# Patient Record
Sex: Female | Born: 1940
Health system: Southern US, Community
[De-identification: ages and names within clinical notes are randomized; demographics above are authoritative.]

## PROBLEM LIST (undated history)

## (undated) ENCOUNTER — Inpatient Hospital Stay: Payer: Self-pay | Admitting: Cardiology

## (undated) DIAGNOSIS — E1149 Type 2 diabetes mellitus with other diabetic neurological complication: Secondary | ICD-10-CM

## (undated) DIAGNOSIS — E782 Mixed hyperlipidemia: Secondary | ICD-10-CM

## (undated) DIAGNOSIS — E119 Type 2 diabetes mellitus without complications: Secondary | ICD-10-CM

## (undated) DIAGNOSIS — I10 Essential (primary) hypertension: Secondary | ICD-10-CM

## (undated) HISTORY — PX: TUBAL LIGATION: SHX77

## (undated) HISTORY — DX: Mixed hyperlipidemia: E78.2

## (undated) HISTORY — DX: Essential (primary) hypertension: I10

## (undated) HISTORY — PX: TEMPOROMANDIBULAR JOINT SURGERY: SHX35

## (undated) HISTORY — PX: CARDIAC CATHETERIZATION: SHX172

## (undated) HISTORY — DX: Type 2 diabetes mellitus without complications: E11.9

## (undated) HISTORY — PX: CARPAL TUNNEL RELEASE: SHX101

## (undated) HISTORY — DX: Type 2 diabetes mellitus with other diabetic neurological complication: E11.49

## (undated) HISTORY — PX: OTHER SURGICAL HISTORY: SHX169

---

## 1999-11-23 ENCOUNTER — Encounter: Payer: Self-pay | Admitting: General Surgery

## 1999-11-23 ENCOUNTER — Encounter: Admission: RE | Admit: 1999-11-23 | Discharge: 1999-11-23 | Payer: Self-pay | Admitting: General Surgery

## 2000-05-19 ENCOUNTER — Encounter: Payer: Self-pay | Admitting: General Surgery

## 2000-05-19 ENCOUNTER — Encounter: Admission: RE | Admit: 2000-05-19 | Discharge: 2000-05-19 | Payer: Self-pay | Admitting: General Surgery

## 2000-11-15 ENCOUNTER — Encounter: Payer: Self-pay | Admitting: General Surgery

## 2000-11-15 ENCOUNTER — Encounter: Admission: RE | Admit: 2000-11-15 | Discharge: 2000-11-15 | Payer: Self-pay | Admitting: General Surgery

## 2001-11-20 ENCOUNTER — Encounter: Payer: Self-pay | Admitting: Obstetrics and Gynecology

## 2001-11-20 ENCOUNTER — Encounter: Admission: RE | Admit: 2001-11-20 | Discharge: 2001-11-20 | Payer: Self-pay | Admitting: Obstetrics and Gynecology

## 2002-12-11 ENCOUNTER — Encounter: Admission: RE | Admit: 2002-12-11 | Discharge: 2002-12-11 | Payer: Self-pay | Admitting: Obstetrics and Gynecology

## 2002-12-11 ENCOUNTER — Encounter: Payer: Self-pay | Admitting: Obstetrics and Gynecology

## 2003-01-02 ENCOUNTER — Ambulatory Visit (HOSPITAL_BASED_OUTPATIENT_CLINIC_OR_DEPARTMENT_OTHER): Admission: RE | Admit: 2003-01-02 | Discharge: 2003-01-02 | Payer: Self-pay | Admitting: Orthopedic Surgery

## 2003-12-16 ENCOUNTER — Encounter: Admission: RE | Admit: 2003-12-16 | Discharge: 2003-12-16 | Payer: Self-pay | Admitting: Obstetrics and Gynecology

## 2004-12-03 ENCOUNTER — Encounter: Admission: RE | Admit: 2004-12-03 | Discharge: 2004-12-03 | Payer: Self-pay | Admitting: Obstetrics and Gynecology

## 2005-12-09 ENCOUNTER — Encounter: Admission: RE | Admit: 2005-12-09 | Discharge: 2005-12-09 | Payer: Self-pay | Admitting: *Deleted

## 2005-12-29 ENCOUNTER — Ambulatory Visit: Payer: Self-pay | Admitting: Cardiology

## 2006-12-12 ENCOUNTER — Encounter: Admission: RE | Admit: 2006-12-12 | Discharge: 2006-12-12 | Payer: Self-pay | Admitting: Obstetrics and Gynecology

## 2007-12-14 ENCOUNTER — Encounter: Admission: RE | Admit: 2007-12-14 | Discharge: 2007-12-14 | Payer: Self-pay | Admitting: Internal Medicine

## 2008-12-16 ENCOUNTER — Encounter: Admission: RE | Admit: 2008-12-16 | Discharge: 2008-12-16 | Payer: Self-pay | Admitting: Internal Medicine

## 2009-12-17 ENCOUNTER — Encounter: Admission: RE | Admit: 2009-12-17 | Discharge: 2009-12-17 | Payer: Self-pay | Admitting: Internal Medicine

## 2010-09-04 DIAGNOSIS — R079 Chest pain, unspecified: Secondary | ICD-10-CM

## 2010-10-15 ENCOUNTER — Encounter: Payer: Self-pay | Admitting: Cardiology

## 2010-10-16 ENCOUNTER — Encounter: Payer: Self-pay | Admitting: *Deleted

## 2010-10-16 ENCOUNTER — Ambulatory Visit (INDEPENDENT_AMBULATORY_CARE_PROVIDER_SITE_OTHER): Payer: Medicare Other | Admitting: Cardiology

## 2010-10-16 ENCOUNTER — Encounter: Payer: Self-pay | Admitting: Cardiology

## 2010-10-16 VITALS — BP 135/86 | HR 78 | Ht 63.0 in | Wt 133.0 lb

## 2010-10-16 DIAGNOSIS — E782 Mixed hyperlipidemia: Secondary | ICD-10-CM

## 2010-10-16 DIAGNOSIS — R079 Chest pain, unspecified: Secondary | ICD-10-CM | POA: Insufficient documentation

## 2010-10-16 DIAGNOSIS — R0602 Shortness of breath: Secondary | ICD-10-CM

## 2010-10-16 DIAGNOSIS — I10 Essential (primary) hypertension: Secondary | ICD-10-CM

## 2010-10-16 DIAGNOSIS — R9430 Abnormal result of cardiovascular function study, unspecified: Secondary | ICD-10-CM | POA: Insufficient documentation

## 2010-10-16 DIAGNOSIS — Z79899 Other long term (current) drug therapy: Secondary | ICD-10-CM

## 2010-10-16 DIAGNOSIS — E119 Type 2 diabetes mellitus without complications: Secondary | ICD-10-CM

## 2010-10-16 LAB — PROTIME-INR

## 2010-10-16 NOTE — Patient Instructions (Signed)
Follow up as scheduled. Your physician has requested that you have a cardiac catheterization. Cardiac catheterization is used to diagnose and/or treat various heart conditions. Doctors may recommend this procedure for a number of different reasons. The most common reason is to evaluate chest pain. Chest pain can be a symptom of coronary artery disease (CAD), and cardiac catheterization can show whether plaque is narrowing or blocking your heart's arteries. This procedure is also used to evaluate the valves, as well as measure the blood flow and oxygen levels in different parts of your heart. For further information please visit [email or web address] Please follow instruction sheet, as given. Your physician recommends that you go to the [facility] for lab work/chest x-ray: Do today. Your physician recommends that you continue on your current medications as directed. Please refer to the Current Medication list given to you today.

## 2010-10-16 NOTE — Assessment & Plan Note (Signed)
On oral hypoglycemic therapy, followed by Dr. Sherryll Burger.

## 2010-10-16 NOTE — Progress Notes (Signed)
Clinical Summary Desiree Baxter is a 70 y.o.female referred for cardiology consultation by Dr. Kirstie Peri. Records indicate that she has had chest pain symptoms, and was referred for a recent exercise Cardiolite, done on March 30. This study showed abnormal ST segment changes in the setting of hypertension and no chest pain. Overall functional capacity was relatively poor. Despite this, perfusion imaging showed no clear evidence of ischemia with LVEF 63%, raising the possibility of "false positive" ECG findings.  She reports a one-year history of intermittent chest pain. She describes a feeling of discomfort in her abdominal area that goes up into her chest, sometimes into the shoulders and neck. She feels diaphoresis with these symptoms, but not all the time. Sometimes her chest discomfort radiates into the left forearm down to the elbow, sometimes the wrist. She reports no clear exertional component to these symptoms. States they've been becoming more frequent. She also has been under increased stress.   Lipid profile from March 2012 showed total cholesterol 178, HDL 66, LDL 100, triglycerides 58. She reports compliance with medications outlined below.  Previous cardiac testing as outlined below as well, done through [facility].  Allergies  Allergen Reactions  . Elavil (Amitriptyline Hcl)     Too sleepy    Current outpatient prescriptions:aspirin 81 MG tablet, Take 81 mg by mouth daily.  , Disp: , Rfl: ;  diphenhydramine-acetaminophen (TYLENOL PM) 25-500 MG TABS, Take 1 tablet by mouth at bedtime as needed.  , Disp: , Rfl: ;  glyBURIDE (DIABETA) 5 MG tablet, Take 5 mg by mouth daily with breakfast.  , Disp: , Rfl: ;  metFORMIN (GLUCOPHAGE) 500 MG tablet, Take 1,000 mg by mouth 2 (two) times daily with a meal. And 1 at lunch , Disp: , Rfl:  metoprolol succinate (TOPROL-XL) 25 MG 24 hr tablet, Take 12.5 mg by mouth daily.  , Disp: , Rfl: ;  Omega-3 Fatty Acids (FISH OIL) 1200 MG CAPS, Take 2  capsules by mouth daily.  , Disp: , Rfl: ;  Red Yeast Rice 600 MG CAPS, Take 2 capsules by mouth daily.  , Disp: , Rfl: ;  DISCONTD: Ascorbic Acid (VITAMIN C) 1000 MG tablet, Take 1,000 mg by mouth daily.  , Disp: , Rfl:  DISCONTD: Calcium 600-200 MG-UNIT per tablet, Take 1 tablet by mouth 3 (three) times daily with meals.  , Disp: , Rfl: ;  DISCONTD: Flaxseed, Linseed, (FLAXSEED OIL) 1000 MG CAPS, Take 1 capsule by mouth daily.  , Disp: , Rfl: ;  DISCONTD: fluticasone (FLONASE) 50 MCG/ACT nasal spray, 2 sprays by Nasal route daily.  , Disp: , Rfl: ;  DISCONTD: imipramine (TOFRANIL) 10 MG tablet, Take 10 mg by mouth at bedtime.  , Disp: , Rfl:  DISCONTD: L-Methylfolate-B6-B12 (METANX) 2.8-25-2 MG TABS, Take 1 tablet by mouth daily.  , Disp: , Rfl: ;  DISCONTD: omeprazole (PRILOSEC) 20 MG capsule, Take 20 mg by mouth daily.  , Disp: , Rfl:   Past Medical History  Diagnosis Date  . Essential hypertension, benign   . Mixed hyperlipidemia   . Type 2 diabetes mellitus     Neuropathy  . GERD (gastroesophageal reflux disease)   . Degenerative joint disease   . Type II or unspecified type diabetes mellitus with neurological manifestations, not stated as uncontrolled   . Endometriosis   . Fibromyalgia   . Lipoma     Past Surgical History  Procedure Date  . Cataract surgery   . Tubal ligation   .  Temporomandibular joint surgery     Family History  Problem Relation Age of Onset  . Cancer Father   . Dementia Mother     Social History Ms. Sipe reports that she has never smoked. She has never used smokeless tobacco. Ms. Keil reports that she does not drink alcohol.  Review of Systems Describes anxiety at times, no palpitations or syncope. No orthopnea or PND. No melena or hematochezia. Otherwise reviewed and negative.  Physical Examination Filed Vitals:   10/16/10 1330  BP: 135/86  Pulse: 78  Normally nourished appearing woman in no acute distress. HEENT: Conjunctiva and lids normal,  oropharynx with moist mucosa. Neck: Supple, no elevated JVP or bruits, no thyromegaly. Lungs: Clear to auscultation, nonlabored. Cardiac: Regular rate and rhythm, no S3 gallop or pericardial rub. Abdomen: Soft, nontender bowel sounds present. Skin: Warm and dry. Musculoskeletal: No kyphosis. Extremities: No pitting edema, distal pulses full. Neuropsychiatric: Alert and oriented x3, affect appropriate..   ECG Sinus rhythm with nonspecific ST changes.  Studies Echocardiogram 08/31/2010: [facility] study. LVEF 60-65%, normal left atrial chamber size, mild to moderate mitral regurgitation, sclerotic aortic valve, mild to moderate tricuspid regurgitation. No pericardial effusion.  Exercise Myoview 07/11/2008: [facility] study. No diagnostic ST segment changes, normal perfusion, LVEF 68%.  Problem List and Plan

## 2010-10-16 NOTE — Assessment & Plan Note (Signed)
Recently on ACE inhibitor, switched to beta blocker by Dr. Sherryll Burger.

## 2010-10-16 NOTE — Assessment & Plan Note (Signed)
Recent LDL 100, on omega-3 supplements and red yeast rice, followed by Dr. Sherryll Burger.

## 2010-10-16 NOTE — Assessment & Plan Note (Signed)
Abnormal exercise treadmill portion of the Cardiolite study, specifically with abnormal ST segment changes in the setting of hypertension but no chest pain. While this could represent a "false positive," this needs to be clarified in light of her progressive symptoms and risk factor profile.

## 2010-10-16 NOTE — Assessment & Plan Note (Signed)
Both typical and atypical features, noted intermittently over the last year in the setting of cardiac risk factors including type 2 diabetes mellitus, hypertension, and hyperlipidemia. Recent Cardiolite showed abnormal ST segment changes, although reassuring perfusion information. She continues to have symptoms with somewhat progressive pattern. After reviewing the situation, including risks and benefits, plan is to proceed with a diagnostic cardiac catheterization to best clarify her coronary anatomy. My hope is that this will provide reassuring information.

## 2010-10-19 ENCOUNTER — Telehealth: Payer: Self-pay | Admitting: Cardiology

## 2010-10-19 ENCOUNTER — Encounter: Payer: Self-pay | Admitting: Cardiology

## 2010-10-19 ENCOUNTER — Encounter: Payer: Self-pay | Admitting: *Deleted

## 2010-10-19 NOTE — Telephone Encounter (Signed)
Left Heart cath on 10/21/10 @ [place] by Dr. Peter [place] Notification # [id number] expires 12/03/10

## 2010-10-21 ENCOUNTER — Inpatient Hospital Stay (HOSPITAL_BASED_OUTPATIENT_CLINIC_OR_DEPARTMENT_OTHER)
Admission: RE | Admit: 2010-10-21 | Discharge: 2010-10-21 | Disposition: A | Discharge status: Home / Self Care | Payer: Medicare Other | Source: Ambulatory Visit | Attending: Cardiology | Admitting: Cardiology

## 2010-10-21 DIAGNOSIS — R079 Chest pain, unspecified: Secondary | ICD-10-CM | POA: Insufficient documentation

## 2010-10-21 DIAGNOSIS — E119 Type 2 diabetes mellitus without complications: Secondary | ICD-10-CM | POA: Insufficient documentation

## 2010-10-21 DIAGNOSIS — I10 Essential (primary) hypertension: Secondary | ICD-10-CM | POA: Insufficient documentation

## 2010-10-21 DIAGNOSIS — E785 Hyperlipidemia, unspecified: Secondary | ICD-10-CM | POA: Insufficient documentation

## 2010-10-23 NOTE — Op Note (Signed)
   NAMEROSALINA, DINGWALL                         ACCOUNT NO.:  [id number]   MEDICAL RECORD NO.:  [id number]                   PATIENT TYPE:  AMB   LOCATION:  DSC                                  FACILITY:  [facility]   PHYSICIAN:  Cindee Salt, M.D.                    DATE OF BIRTH:  [birth year]   DATE OF PROCEDURE:  01/02/2003  DATE OF DISCHARGE:                                 OPERATIVE REPORT   PREOPERATIVE DIAGNOSIS:  Carpal tunnel syndrome, right hand.   POSTOPERATIVE DIAGNOSIS:  Carpal tunnel syndrome, right hand.   OPERATION:  Decompression of the median nerve.   SURGEON:  Cindee Salt, M.D.   ASSISTANT:  Dr. Chelsea Primus.   ANESTHESIA:  Forearm based IV regional.   HISTORY OF PRESENT ILLNESS:  The patient is a 70 year old female with a  history of carpal tunnel syndrome.  Median G nerve conduction was positive.  This has not responded to conservative treatment.   PROCEDURE:  The patient was brought to the operating room.  A forearm based  IV regional anesthetic was carried out without difficulty.  She was prepped  and draped using DuraPrep.  In the supine position with right arm free, a  longitudinal incision was made in the palm and carried down through  subcutaneous tissues.  Bleeders were allowed to cauterize.  The palmar  fascia was split.  The superficial palmar arch was identified.  The flexor  tendon to the right and middle finger were identified to the ulnar side of  the median nerve.  The carpal retinaculum was incised with sharp dissection.  A right angle and Sewell retractor were placed between skin and forearm  fascia.  The fascia was released for approximately 3 cm proximal to the  wrist.  Under direct vision, canal was explored.  No further lesions were  identified.  The wound was irrigated.  Skin was closed with interrupted 5-0  nylon sutures.  A sterile compressive dressing and splint was applied to the  hand.  The patient tolerated the procedure well and was taken  to the  recovery room for observation in satisfactory condition.  She is to return  to the hand center in [place] in one week on Vicodin and Keflex.  She was  given 1 g Ancef perioperatively for prophylactic antibiotics due to her  mitral valve prolapse.                                               Cindee Salt, M.D.    Angelique Blonder  D:  01/02/2003  T:  01/02/2003  Job:  [id number]

## 2010-10-29 NOTE — Cardiovascular Report (Signed)
  NAMEGRAINNE, KNIGHTS NO.:  [id number]  MEDICAL RECORD NO.:  [id number]          PATIENT TYPE:  LOCATION:                                 FACILITY:  PHYSICIAN:  Nevan Creighton M. [place], M.D.       DATE OF BIRTH:  DATE OF PROCEDURE: DATE OF DISCHARGE:                           CARDIAC CATHETERIZATION   INDICATIONS FOR PROCEDURE:  A 70 year old white female with history of diabetes mellitus, hyperlipidemia, and hypertension, who is being evaluated for chest pain.  She had a recent stress Cardiolite study which showed a very poor exercise tolerance with abnormal ST-segment changes, but normal perfusion imaging.  There was concern as this is a falsely negative result.  PROCEDURES: 1. Left heart catheterization. 2. Coronary and left ventricular angiography.  ACCESS:  Via the right femoral artery using standard Seldinger technique.  EQUIPMENTS:  Four-French 4-cm left Judkins catheter, 4-French 3D RC catheter, 4-French pigtail catheter, 4-French arterial sheath.  MEDICATIONS:  Local anesthesia with 1% Xylocaine, Versed 1 mg IV, fentanyl 25 mcg IV.  CONTRAST:  80 mL of Omnipaque.  HEMODYNAMIC DATA:  Aortic pressure is 172/87 with a mean of 124 mmHg, left ventricular pressure is 168 with EDP of 21 mmHg.  ANGIOGRAPHIC DATA:  The left coronary arises and distributes normally. The left main coronary artery is relatively short and is normal. The left anterior descending artery appears normal throughout. The left circumflex coronary artery is normal.  It gives rise to 3 obtuse marginal vessels. The right coronary artery arises and distributes normally.  It is a normal vessel. Left ventricular angiography was performed in the RAO view.  This demonstrates normal left ventricular size and contractility.  Ejection fraction is estimated at 65%.  There was no mitral insufficiency or prolapse.  FINAL INTERPRETATION: 1. Normal coronary anatomy. 2. Normal left ventricular  function.          ______________________________ Desiree Baxter M. [place], M.D.     PMJ/MEDQ  D:  10/21/2010  T:  10/21/2010  Job:  [id number]  cc:   Jonelle Sidle, MD Kirstie Peri, MD  Electronically Signed by Sayan Aldava [place] M.D. on 10/29/2010 11:51:04 AM

## 2010-11-13 ENCOUNTER — Ambulatory Visit (INDEPENDENT_AMBULATORY_CARE_PROVIDER_SITE_OTHER): Payer: Medicare Other | Admitting: Cardiology

## 2010-11-13 ENCOUNTER — Encounter: Payer: Self-pay | Admitting: Cardiology

## 2010-11-13 DIAGNOSIS — R9430 Abnormal result of cardiovascular function study, unspecified: Secondary | ICD-10-CM

## 2010-11-13 DIAGNOSIS — I10 Essential (primary) hypertension: Secondary | ICD-10-CM

## 2010-11-13 NOTE — Assessment & Plan Note (Signed)
Reassuring cardiac catheterization, demonstrating normal coronary arteries despite multiple cardiac risk factors. Plan for general risk factor modification, followup with Dr. Sherryll Burger.

## 2010-11-13 NOTE — Assessment & Plan Note (Signed)
Medical therapy being modified based on increased blood pressures. Follow up with Dr. Sherryll Burger as scheduled later this month.

## 2010-11-13 NOTE — Patient Instructions (Signed)
   Follow up as needed. Your physician recommends that you continue on your current medications as directed. Please refer to the Current Medication list given to you today. 

## 2010-11-13 NOTE — Progress Notes (Signed)
Clinical Summary Desiree Baxter is a 70 y.o.female presenting for followup. She had been referred for consultation in mid May by Dr. Sherryll Burger with a history of chest pain, and overall equivocal Cardiolite showing no perfusion evidence of ischemia and possible false positive ECG changes. In light of continued symptoms and multiple cardiac risk factors, a diagnostic cardiac catheterization was arranged. Fortunately this study showed normal coronary arteries.  We discussed this today. She was very reassured. Generally plan would be to pursue risk factor modification strategies. Diet and exercise.   Allergies  Allergen Reactions  . Elavil (Amitriptyline Hcl)     Too sleepy    Current outpatient prescriptions:aspirin 81 MG tablet, Take 81 mg by mouth daily.  , Disp: , Rfl: ;  diphenhydramine-acetaminophen (TYLENOL PM) 25-500 MG TABS, Take 1 tablet by mouth at bedtime as needed.  , Disp: , Rfl: ;  Flaxseed, Linseed, (FLAXSEED OIL) 1000 MG CAPS, Take by mouth as directed.  , Disp: , Rfl: ;  glyBURIDE (DIABETA) 5 MG tablet, Take 5 mg by mouth daily with breakfast.  , Disp: , Rfl:  lisinopril (PRINIVIL,ZESTRIL) 5 MG tablet, , Disp: , Rfl: ;  metFORMIN (GLUCOPHAGE) 500 MG tablet, Take 1,000 mg by mouth 2 (two) times daily with a meal. And 1 at lunch , Disp: , Rfl: ;  Omega-3 Fatty Acids (FISH OIL) 1200 MG CAPS, Take 2 capsules by mouth daily.  , Disp: , Rfl: ;  ONE TOUCH ULTRA TEST test strip, , Disp: , Rfl: ;  Red Yeast Rice 600 MG CAPS, Take 2 capsules by mouth daily.  , Disp: , Rfl:  metoprolol succinate (TOPROL-XL) 25 MG 24 hr tablet, Take 12.5 mg by mouth daily.  , Disp: , Rfl:   Past Medical History  Diagnosis Date  . Essential hypertension, benign   . Mixed hyperlipidemia   . Type 2 diabetes mellitus     Neuropathy  . GERD (gastroesophageal reflux disease)   . Degenerative joint disease   . Type II or unspecified type diabetes mellitus with neurological manifestations, not stated as uncontrolled   .  Endometriosis   . Fibromyalgia   . Lipoma     Social History Desiree Baxter reports that she has never smoked. She has never used smokeless tobacco. Desiree Baxter reports that she does not drink alcohol.  Review of Systems No reported post catheterization access site palpitations. Otherwise negative.  Physical Examination Filed Vitals:   11/13/10 1450  BP: 137/75  Pulse: 91  Resp: 18   Normally nourished appearing woman in no acute distress.  HEENT: Conjunctiva and lids normal, oropharynx with moist mucosa.  Neck: Supple, no elevated JVP or bruits, no thyromegaly.  Lungs: Clear to auscultation, nonlabored.  Cardiac: Regular rate and rhythm, no S3 gallop or pericardial rub.  Abdomen: Soft, nontender bowel sounds present.  Skin: Warm and dry.  Musculoskeletal: No kyphosis.  Extremities: No pitting edema, distal pulses full.  Neuropsychiatric: Alert and oriented x3, affect appropriate..   Studies Cardiac catheterization 10/22/2010: HEMODYNAMIC DATA:  Aortic pressure is 172/87 with a mean of 124 mmHg,   left ventricular pressure is 168 with EDP of 21 mmHg.      ANGIOGRAPHIC DATA:  The left coronary arises and distributes normally.   The left main coronary artery is relatively short and is normal.   The left anterior descending artery appears normal throughout.   The left circumflex coronary artery is normal.  It gives rise to 3   obtuse marginal vessels.  The right coronary artery arises and distributes normally.  It is a   normal vessel.   Left ventricular angiography was performed in the RAO view.  This   demonstrates normal left ventricular size and contractility.  Ejection   fraction is estimated at 65%.  There was no mitral insufficiency or   prolapse.  Problem List and Plan

## 2010-11-16 ENCOUNTER — Other Ambulatory Visit: Payer: Self-pay | Admitting: Obstetrics and Gynecology

## 2010-11-16 DIAGNOSIS — R922 Inconclusive mammogram: Secondary | ICD-10-CM

## 2010-11-23 ENCOUNTER — Other Ambulatory Visit: Payer: Self-pay | Admitting: Obstetrics and Gynecology

## 2010-11-23 ENCOUNTER — Ambulatory Visit
Admission: RE | Admit: 2010-11-23 | Discharge: 2010-11-23 | Disposition: A | Discharge status: Home / Self Care | Payer: 59 | Source: Ambulatory Visit | Attending: Obstetrics and Gynecology | Admitting: Obstetrics and Gynecology

## 2010-11-23 ENCOUNTER — Ambulatory Visit
Admission: RE | Admit: 2010-11-23 | Discharge: 2010-11-23 | Disposition: A | Discharge status: Home / Self Care | Payer: Medicare Other | Source: Ambulatory Visit | Attending: Obstetrics and Gynecology | Admitting: Obstetrics and Gynecology

## 2010-11-23 DIAGNOSIS — Z1239 Encounter for other screening for malignant neoplasm of breast: Secondary | ICD-10-CM

## 2010-11-23 DIAGNOSIS — R922 Inconclusive mammogram: Secondary | ICD-10-CM

## 2010-12-08 ENCOUNTER — Encounter: Payer: Self-pay | Admitting: Cardiology

## 2010-12-22 ENCOUNTER — Ambulatory Visit
Admission: RE | Admit: 2010-12-22 | Discharge: 2010-12-22 | Disposition: A | Discharge status: Home / Self Care | Payer: Medicare Other | Source: Ambulatory Visit | Attending: Obstetrics and Gynecology | Admitting: Obstetrics and Gynecology

## 2010-12-22 DIAGNOSIS — Z1239 Encounter for other screening for malignant neoplasm of breast: Secondary | ICD-10-CM

## 2011-05-05 ENCOUNTER — Encounter (HOSPITAL_COMMUNITY): Payer: Self-pay | Admitting: Pharmacy Technician

## 2011-05-06 ENCOUNTER — Encounter (HOSPITAL_COMMUNITY)
Admission: RE | Admit: 2011-05-06 | Discharge: 2011-05-06 | Disposition: A | Discharge status: Home / Self Care | Payer: Medicare Other | Source: Ambulatory Visit | Attending: Ophthalmology | Admitting: Ophthalmology

## 2011-05-06 ENCOUNTER — Encounter (HOSPITAL_COMMUNITY): Payer: Self-pay

## 2011-05-06 LAB — BASIC METABOLIC PANEL
CO2: 26 mEq/L (ref 19–32)
Calcium: 9.7 mg/dL (ref 8.4–10.5)
Creatinine, Ser: 0.69 mg/dL (ref 0.50–1.10)
GFR calc non Af Amer: 87 mL/min — ABNORMAL LOW (ref >90)
Sodium: 134 mEq/L — ABNORMAL LOW (ref 135–145)

## 2011-05-06 LAB — CBC
MCH: 29.8 pg (ref 26.0–34.0)
MCHC: 33.3 g/dL (ref 30.0–36.0)
MCV: 89.3 fL (ref 78.0–100.0)
Platelets: 294 10*3/uL (ref 150–400)

## 2011-05-06 NOTE — Patient Instructions (Signed)
21 Desiree Baxter  05/06/2011   Your procedure is scheduled on:  Thursday, 05/13/11  Report to [facility] at 06:15 AM.  Call this number if you have problems the morning of surgery: [phone]   Remember:   Do not eat food:After Midnight.  May have clear liquids:until Midnight .  Clear liquids include soda, tea, black coffee, apple or grape juice, broth.  Take these medicines the morning of surgery with A SIP OF WATER: Toprol and lisinopril. DO NOT take your pills for your Diabetes that morning.   Do not wear jewelry, make-up or nail polish.  Do not wear lotions, powders, or perfumes. You may wear deodorant.  Do not bring valuables to the hospital.  Contacts, dentures or bridgework may not be worn into surgery.    Patients discharged the day of surgery will not be allowed to drive home.  Name and phone number of your driver: driver  Special Instructions: N/A   Please read over the following fact sheets that you were given: Anesthesia Post-op Instructions   Cataract A cataract is a clouding of the lens of the eye. It is most often related to aging. A cataract is not a "film" over the surface of the eye. The lens is inside the eye and changes size of the pupil. The lens can enlarge to let more light enter the eye in dark environments and contract the size of the pupil to let in bright light. The lens is the part of the eye that helps focus light on the retina. The retina is the eye's light-sensitive layer. It is in the back of the eye that sends visual signals to the brain. In a normal eye, light passes through the lens and gets focused on the retina. To help produce a sharp image, the lens must remain clear. When a lens becomes cloudy, vision is compromised by the degree and nature of the clouding. Certain cataracts make people more near-sighted as they develop, others increase glare, and all reduce vision to some degree or another. A cataract that is so dense that it becomes milky white and  a white opacity can be seen through the pupil. When the white color is seen, it is called a "mature" or "hyper-mature cataract." Such cataracts cause total blindness in the affected eye. The cataract must be removed to prevent damage to the eye itself. Some types of cataracts can cause a secondary disease of the eye, such as certain types of glaucoma. In the early stages, better lighting and eyeglasses may lessen vision problems caused by cataracts. At a certain point, surgery may be needed to improve vision. CAUSES   Aging. However, cataracts may occur at any age, even in newborns.   Certain drugs.   Trauma to the eye.   Certain diseases (such as diabetes).   Inherited or acquired medical syndromes.  SYMPTOMS   Gradual, progressive drop in vision in the affected eye. Cataracts may develop at different rates in each eye. Cataracts may even be in just one eye with the other unaffected.   Cataracts due to trauma may develop quickly, sometimes over a matter or days or even hours. The result is severe and rapid visual loss.  DIAGNOSIS  To detect a cataract, an eye doctor examines the lens. A well developed cataract can be diagnosed without dilating the pupil. Early cataracts and others of a specific nature are best diagnosed with an exam of the eyes with the pupils dilated by drops. TREATMENT   For  an early cataract, vision may improve by using different eyeglasses or stronger lighting.   If the above measures do not help, surgery is the only effective treatment. This treatment removes the cloudy lens and replaces it with a substitute lens (Intraocular lens, or IOL). Newly developed IOL technology allows the implanted lens to improve vision both at a distance and up close. Discuss with your eye surgeon about the possibility of still needing glasses. Also discuss how visual coordination between both eyes will be affected.  A cataract needs to be removed only when vision loss interferes with your  everyday activities such as driving, reading or watching TV. You and your eye doctor can make that decision together. In most cases, waiting until you are ready to have cataract surgery will not harm your eye. If you have cataracts in both eyes, only one should be removed at a time. This allows the operated eye to heal and be out of danger from serious problems (such as infection or poor wound healing) before having the other eye undergo surgery.  Sometimes, a cataract should be removed even if it does not cause problems with your vision. For example, a cataract should be removed if it prevents examination or treatment of another eye problem. Just as you cannot see out of the affected eye well, your doctor cannot see into your eye well through a cataract. The vast majority of people who have cataract surgery have better vision afterward. CATARACT REMOVAL There are two primary ways to remove a cataract. Your doctor can explain the differences and help determine which is best for you:  Phacoemulsification (small incision cataract surgery). This involves making a small cut (incision) on the edge of the clear, dome-shaped surface that covers the front of the eye (the cornea). An injection behind the eye or eye drops are given to make this a painless procedure. The doctor then inserts a tiny probe into the eye. This device emits ultrasound waves that soften and break up the cloudy center of the lens so it can be removed by suction. Most cataract surgery is done this way. The cuts are usually so small and performed in such a manner that often no sutures are needed to keep it closed.   Extracapsular surgery. Your doctor makes a slightly longer incision on the side of the cornea. The doctor removes the hard center of the lens. The remainder of the lens is then removed by suction. In some cases, extremely fine sutures are needed which the doctor may, or may not remove in the office after the surgery.  When an IOL is  implanted, it needs no care. It becomes a permanent part of your eye and cannot be seen or felt.  Some people cannot have an IOL. They may have problems during surgery, or maybe they have another eye disease. For these people, a soft contact lens may be suggested. If an IOL or contact lens cannot be used, very powerful and thick glasses are required after surgery. Since vision is very different through such thick glasses, it is important to have your doctor discuss the impact on your vision after any cataract surgery where there is no plan to implant an IOL. The normal lens of the eye is covered by a clear capsule. Both phacoemulsification and extracapsular surgery require that the back surface of this lens capsule be left in place. This helps support IOLs and prevents the IOL from dislocating and falling back into the deeper interior of the eye. Right  after surgery, and often permanently this "posterior capsule" remains clear. In some cases however, it can become cloudy, presenting the same type of visual compromise that the original cataract did since light is again obstructed as it passes through the clear IOL. This condition is often referred to as an "after-cataract." Fortunately, after-cataracts are easily treated using a painless and very fast laser treatment that is performed without anesthesia or incisions. It is done in a matter of minutes in an outpatient environment. Visual improvement is often immediate.  HOME CARE INSTRUCTIONS   Your surgeon will discuss pre and post operative care with you prior to surgery. The majority of people are able to do almost all normal activities right away. Although, it is often advised to avoid strenuous activity for a period of time.   Postoperative drops and careful avoidance of infection will be needed. Many surgeons suggest the use of a protective shield during the first few days after surgery.   There is a very small incidence of complication from modern  cataract surgery, but it can happen. Infection that spreads to the inside of the eye (endophthalmitis) can result in total visual loss and even loss of the eye itself. In extremely rare instances, the inflammation of endophthalmitis can spread to both eyes (sympathetic ophthalmia). Appropriate post-operative care under the close observation of your surgeon is essential to a successful outcome.  SEEK IMMEDIATE MEDICAL CARE IF:   You have any sudden drop of vision in the operated eye.   You have pain in the operated eye.   You see a large number of floating dots in the field of vision in the operated eye.   You see flashing lights, or if a portion of your side vision in any direction appears black (like a curtain being drawn into your field of vision) in the operated eye.  Document Released: 05/24/2005 Document Revised: 02/03/2011 Document Reviewed: 07/10/2007 [facility] Patient Information 2012 [place], [place].

## 2011-05-13 ENCOUNTER — Encounter (HOSPITAL_COMMUNITY)
Admission: RE | Disposition: A | Discharge status: Home / Self Care | Payer: Self-pay | Source: Ambulatory Visit | Attending: Ophthalmology

## 2011-05-13 ENCOUNTER — Encounter (HOSPITAL_COMMUNITY): Payer: Self-pay | Admitting: *Deleted

## 2011-05-13 ENCOUNTER — Other Ambulatory Visit: Payer: Self-pay

## 2011-05-13 ENCOUNTER — Ambulatory Visit (HOSPITAL_COMMUNITY): Payer: Medicare Other | Admitting: Anesthesiology

## 2011-05-13 ENCOUNTER — Ambulatory Visit (HOSPITAL_COMMUNITY)
Admission: RE | Admit: 2011-05-13 | Discharge: 2011-05-13 | Disposition: A | Discharge status: Home / Self Care | Payer: Medicare Other | Source: Ambulatory Visit | Attending: Ophthalmology | Admitting: Ophthalmology

## 2011-05-13 ENCOUNTER — Encounter (HOSPITAL_COMMUNITY): Payer: Self-pay | Admitting: Anesthesiology

## 2011-05-13 DIAGNOSIS — Z79899 Other long term (current) drug therapy: Secondary | ICD-10-CM | POA: Insufficient documentation

## 2011-05-13 DIAGNOSIS — Z01812 Encounter for preprocedural laboratory examination: Secondary | ICD-10-CM | POA: Insufficient documentation

## 2011-05-13 DIAGNOSIS — H2510 Age-related nuclear cataract, unspecified eye: Secondary | ICD-10-CM | POA: Insufficient documentation

## 2011-05-13 DIAGNOSIS — I10 Essential (primary) hypertension: Secondary | ICD-10-CM | POA: Insufficient documentation

## 2011-05-13 DIAGNOSIS — E119 Type 2 diabetes mellitus without complications: Secondary | ICD-10-CM | POA: Insufficient documentation

## 2011-05-13 HISTORY — PX: CATARACT EXTRACTION W/PHACO: SHX586

## 2011-05-13 LAB — GLUCOSE, CAPILLARY: Glucose-Capillary: 188 mg/dL — ABNORMAL HIGH (ref 70–99)

## 2011-05-13 SURGERY — PHACOEMULSIFICATION, CATARACT, WITH IOL INSERTION
Anesthesia: Monitor Anesthesia Care | Site: Eye | Laterality: Right | Wound class: Clean

## 2011-05-13 MED ORDER — LIDOCAINE HCL (PF) 1 % IJ SOLN
INTRAMUSCULAR | Status: DC | PRN
  Administered 2011-05-13: .4 mL

## 2011-05-13 MED ORDER — LIDOCAINE 3.5 % OP GEL OPTIME - NO CHARGE
OPHTHALMIC | Status: DC | PRN
  Administered 2011-05-13: 1 [drp] via OPHTHALMIC

## 2011-05-13 MED ORDER — BSS IO SOLN
INTRAOCULAR | Status: DC | PRN
  Administered 2011-05-13: 15 mL via INTRAOCULAR

## 2011-05-13 MED ORDER — EPINEPHRINE HCL 1 MG/ML IJ SOLN
INTRAMUSCULAR | Status: AC
  Filled 2011-05-13: qty 1

## 2011-05-13 MED ORDER — CYCLOPENTOLATE-PHENYLEPHRINE 0.2-1 % OP SOLN
1.0000 [drp] | OPHTHALMIC | Status: AC
  Administered 2011-05-13 (×3): 1 [drp] via OPHTHALMIC

## 2011-05-13 MED ORDER — LIDOCAINE HCL (PF) 1 % IJ SOLN
INTRAMUSCULAR | Status: AC
  Filled 2011-05-13: qty 2

## 2011-05-13 MED ORDER — EPINEPHRINE HCL 1 MG/ML IJ SOLN
INTRAOCULAR | Status: DC | PRN
  Administered 2011-05-13: 08:00:00

## 2011-05-13 MED ORDER — NEOMYCIN-POLYMYXIN-DEXAMETH 0.1 % OP OINT
TOPICAL_OINTMENT | OPHTHALMIC | Status: DC | PRN
  Administered 2011-05-13: 1 via OPHTHALMIC

## 2011-05-13 MED ORDER — MIDAZOLAM HCL 2 MG/2ML IJ SOLN
INTRAMUSCULAR | Status: AC
  Filled 2011-05-13: qty 2

## 2011-05-13 MED ORDER — PROVISC 10 MG/ML IO SOLN
INTRAOCULAR | Status: DC | PRN
  Administered 2011-05-13: 8.5 mg via INTRAOCULAR

## 2011-05-13 MED ORDER — CYCLOPENTOLATE-PHENYLEPHRINE 0.2-1 % OP SOLN
OPHTHALMIC | Status: AC
  Administered 2011-05-13: 1 [drp] via OPHTHALMIC
  Filled 2011-05-13: qty 2

## 2011-05-13 MED ORDER — TETRACAINE HCL 0.5 % OP SOLN
OPHTHALMIC | Status: AC
  Filled 2011-05-13: qty 2

## 2011-05-13 MED ORDER — TETRACAINE HCL 0.5 % OP SOLN
1.0000 [drp] | OPHTHALMIC | Status: AC
  Administered 2011-05-13 (×3): 1 [drp] via OPHTHALMIC

## 2011-05-13 MED ORDER — MIDAZOLAM HCL 2 MG/2ML IJ SOLN
1.0000 mg | INTRAMUSCULAR | Status: DC | PRN
  Administered 2011-05-13: 2 mg via INTRAVENOUS

## 2011-05-13 MED ORDER — LACTATED RINGERS IV SOLN
INTRAVENOUS | Status: DC
  Administered 2011-05-13: 1000 mL via INTRAVENOUS

## 2011-05-13 MED ORDER — NEOMYCIN-POLYMYXIN-DEXAMETH 3.5-10000-0.1 OP OINT
TOPICAL_OINTMENT | OPHTHALMIC | Status: AC
  Filled 2011-05-13: qty 3.5

## 2011-05-13 MED ORDER — LIDOCAINE HCL 3.5 % OP GEL
OPHTHALMIC | Status: AC
  Administered 2011-05-13: 1 via OPHTHALMIC
  Filled 2011-05-13: qty 5

## 2011-05-13 MED ORDER — LIDOCAINE HCL 3.5 % OP GEL
1.0000 "application " | Freq: Once | OPHTHALMIC | Status: AC
  Administered 2011-05-13: 1 via OPHTHALMIC

## 2011-05-13 MED ORDER — PHENYLEPHRINE HCL 2.5 % OP SOLN
OPHTHALMIC | Status: AC
  Administered 2011-05-13: 1 [drp] via OPHTHALMIC
  Filled 2011-05-13: qty 2

## 2011-05-13 MED ORDER — PHENYLEPHRINE HCL 2.5 % OP SOLN
1.0000 [drp] | OPHTHALMIC | Status: AC
  Administered 2011-05-13 (×3): 1 [drp] via OPHTHALMIC

## 2011-05-13 SURGICAL SUPPLY — 32 items

## 2011-05-13 NOTE — Op Note (Signed)
NAMELINDY, Desiree Baxter               ACCOUNT NO.:  [id number]  MEDICAL RECORD NO.:  [id number]  LOCATION:  [facility]                          FACILITY:  [facility]  PHYSICIAN:  Susanne Greenhouse, MD       DATE OF BIRTH:  05-Oct-1940  DATE OF PROCEDURE:  05/13/2011 DATE OF DISCHARGE:  05/13/2011                              OPERATIVE REPORT   PREOPERATIVE DIAGNOSIS:  Nuclear cataract, right eye.  Diagnosis code 366.16.  POSTOPERATIVE DIAGNOSIS:  Nuclear cataract, right eye.  Diagnosis code 366.16.  OPERATION PERFORMED:  Phacoemulsification with posterior chamber intraocular lens implantation, right eye.  SURGEON:  Bonne Dolores. Leylany Nored, MD  ANESTHESIA:  General endotracheal anesthesia.  OPERATIVE SUMMARY:  In the preoperative area, dilating drops were placed into the right eye.  The patient was then brought into the operating room where she was placed under general anesthesia.  The eye was then prepped and draped.  Beginning with a 75 blade, a paracentesis port was made at the surgeon's 2 o'clock position.  The anterior chamber was then filled with a 1% nonpreserved lidocaine solution with epinephrine.  This was followed by Viscoat to deepen the chamber.  A small fornix-based peritomy was performed superiorly.  Next, a single iris hook was placed through the limbus superiorly.  A 2.4-mm keratome blade was then used to make a clear corneal incision over the iris hook.  A bent cystotome needle and Utrata forceps were used to create a continuous tear capsulotomy.  Hydrodissection was performed using balanced salt solution on a fine cannula.  The lens nucleus was then removed using phacoemulsification in a quadrant cracking technique.  The cortical material was then removed with irrigation and aspiration.  The capsular bag and anterior chamber were refilled with Provisc.  The wound was widened to approximately 3 mm and a posterior chamber intraocular lens was placed into the capsular bag without difficulty  using an [employer] lens injecting system.  A single 10-0 nylon suture was then used to close the incision as well as stromal hydration.  The Provisc was removed from the anterior chamber and capsular bag with irrigation and aspiration.  At this point, the wounds were tested for leak, which were negative.  The anterior chamber remained deep and stable.  The patient tolerated the procedure well.  There were no operative complications, and she awoke from general anesthesia without problem.  No surgical specimens.  Prosthetic device used is a Lenstec posterior chamber lens, model Softec HD, power of 21.5, serial number is [id number].          ______________________________ Susanne Greenhouse, MD     KEH/MEDQ  D:  05/13/2011  T:  05/13/2011  Job:  [id number]

## 2011-05-13 NOTE — Anesthesia Preprocedure Evaluation (Signed)
Anesthesia Evaluation  Patient identified by MRN, date of birth, ID band Patient awake    Reviewed: Allergy & Precautions, H&P , NPO status , Patient's Chart, lab work & pertinent test results  History of Anesthesia Complications Negative for: history of anesthetic complications  Airway Mallampati: I      Dental  (+) Teeth Intact   Pulmonary neg pulmonary ROS,  clear to auscultation        Cardiovascular hypertension, Pt. on medications Regular Normal Hx syncope 1 wk ago, neg w/u. Not recurred.   Neuro/Psych PSYCHIATRIC DISORDERS Anxiety    GI/Hepatic   Endo/Other  Diabetes mellitus-, Well Controlled, Type 2, Oral Hypoglycemic Agents  Renal/GU      Musculoskeletal   Abdominal   Peds  Hematology   Anesthesia Other Findings   Reproductive/Obstetrics                           Anesthesia Physical Anesthesia Plan  ASA: III  Anesthesia Plan: MAC   Post-op Pain Management:    Induction:   Airway Management Planned: Nasal Cannula  Additional Equipment:   Intra-op Plan:   Post-operative Plan:   Informed Consent: I have reviewed the patients History and Physical, chart, labs and discussed the procedure including the risks, benefits and alternatives for the proposed anesthesia with the patient or authorized representative who has indicated his/her understanding and acceptance.     Plan Discussed with:   Anesthesia Plan Comments:         Anesthesia Quick Evaluation

## 2011-05-13 NOTE — H&P (Signed)
I have reviewed the H&P, the patient was re-examined, and I have identified no interval changes in medical condition and plan of care since the history and physical of record  

## 2011-05-13 NOTE — Brief Op Note (Signed)
Pre-Op Dx: Cataract OD Post-Op Dx: Cataract OD Surgeon: Kiki Bivens Anesthesia: Topical with MAC Implant: Lenstec, Model Softec HD Blood Loss: None Specimen: None Complications: None 

## 2011-05-13 NOTE — Anesthesia Postprocedure Evaluation (Signed)
  Anesthesia Post-op Note  Patient: Desiree Baxter  Procedure(s) Performed:  CATARACT EXTRACTION PHACO AND INTRAOCULAR LENS PLACEMENT (IOC) - CDE=17.48  Patient Location: PACU and Short Stay  Anesthesia Type: MAC  Level of Consciousness: awake, alert  and oriented  Airway and Oxygen Therapy: Patient Spontanous Breathing  Post-op Pain: none  Post-op Assessment: Post-op Vital signs reviewed, Patient's Cardiovascular Status Stable, Respiratory Function Stable and No signs of Nausea or vomiting  Post-op Vital Signs: Reviewed and stable  Complications: No apparent anesthesia complications

## 2011-05-13 NOTE — Progress Notes (Signed)
Last Friday 30 of November  Pt states she felt like she was going to pass out  Was at [employer] arrived and assessed pt vs stable   Than went to dr Clelia Croft Friday afternoon at 3 pm  Pt to wear a holter monitor starting dec 7  ekg done this am  Pt states she feels fine this morning   This episode has  Occurred since her visit with [facility] during the registration process   Dr Marcos Eke and dr  Informed of this episode

## 2011-05-13 NOTE — Transfer of Care (Signed)
Immediate Anesthesia Transfer of Care Note  Patient: Desiree Baxter  Procedure(s) Performed:  CATARACT EXTRACTION PHACO AND INTRAOCULAR LENS PLACEMENT (IOC) - CDE=17.48  Patient Location: PACU and Short Stay  Anesthesia Type: MAC  Level of Consciousness: awake, alert  and oriented  Airway & Oxygen Therapy: Patient Spontanous Breathing  Post-op Assessment: Report given to PACU RN  Post vital signs: Reviewed and stable  Complications: No apparent anesthesia complications

## 2011-05-14 MED ORDER — ONDANSETRON HCL 4 MG/2ML IJ SOLN: 4.0000 mg | Freq: Once | INTRAMUSCULAR | Status: AC | PRN

## 2011-05-14 MED ORDER — FENTANYL CITRATE 0.05 MG/ML IJ SOLN: 25.0000 ug | INTRAMUSCULAR | Status: AC | PRN

## 2011-05-20 ENCOUNTER — Encounter (HOSPITAL_COMMUNITY): Payer: Self-pay | Admitting: Ophthalmology

## 2011-11-30 ENCOUNTER — Other Ambulatory Visit: Payer: Self-pay | Admitting: Obstetrics and Gynecology

## 2011-11-30 DIAGNOSIS — Z1231 Encounter for screening mammogram for malignant neoplasm of breast: Secondary | ICD-10-CM

## 2011-12-24 ENCOUNTER — Ambulatory Visit
Admission: RE | Admit: 2011-12-24 | Discharge: 2011-12-24 | Disposition: A | Discharge status: Home / Self Care | Payer: Medicare Other | Source: Ambulatory Visit | Attending: Obstetrics and Gynecology | Admitting: Obstetrics and Gynecology

## 2011-12-24 DIAGNOSIS — Z1231 Encounter for screening mammogram for malignant neoplasm of breast: Secondary | ICD-10-CM

## 2011-12-28 ENCOUNTER — Ambulatory Visit: Admission: RE | Admit: 2011-12-28 | Payer: Medicare Other | Source: Ambulatory Visit

## 2011-12-28 ENCOUNTER — Ambulatory Visit
Admission: RE | Admit: 2011-12-28 | Discharge: 2011-12-28 | Disposition: A | Discharge status: Home / Self Care | Payer: Medicare Other | Source: Ambulatory Visit | Attending: Obstetrics and Gynecology | Admitting: Obstetrics and Gynecology

## 2011-12-28 ENCOUNTER — Other Ambulatory Visit: Payer: Self-pay | Admitting: Obstetrics and Gynecology

## 2011-12-28 DIAGNOSIS — Z1231 Encounter for screening mammogram for malignant neoplasm of breast: Secondary | ICD-10-CM

## 2012-11-20 ENCOUNTER — Other Ambulatory Visit: Payer: Self-pay

## 2012-11-20 DIAGNOSIS — Z1231 Encounter for screening mammogram for malignant neoplasm of breast: Secondary | ICD-10-CM

## 2012-12-28 ENCOUNTER — Ambulatory Visit
Admission: RE | Admit: 2012-12-28 | Discharge: 2012-12-28 | Disposition: A | Discharge status: Home / Self Care | Payer: Medicare Other | Source: Ambulatory Visit

## 2012-12-28 DIAGNOSIS — Z1231 Encounter for screening mammogram for malignant neoplasm of breast: Secondary | ICD-10-CM

## 2013-10-04 ENCOUNTER — Encounter (HOSPITAL_COMMUNITY): Payer: Self-pay | Admitting: Pharmacy Technician

## 2013-10-08 ENCOUNTER — Encounter (HOSPITAL_COMMUNITY)
Admission: RE | Admit: 2013-10-08 | Discharge: 2013-10-08 | Disposition: A | Discharge status: Home / Self Care | Payer: Medicare Other | Source: Ambulatory Visit | Attending: Ophthalmology | Admitting: Ophthalmology

## 2013-10-08 ENCOUNTER — Encounter (HOSPITAL_COMMUNITY): Payer: Self-pay

## 2013-10-08 DIAGNOSIS — Z01810 Encounter for preprocedural cardiovascular examination: Secondary | ICD-10-CM | POA: Insufficient documentation

## 2013-10-08 DIAGNOSIS — Z01812 Encounter for preprocedural laboratory examination: Secondary | ICD-10-CM | POA: Insufficient documentation

## 2013-10-08 LAB — BASIC METABOLIC PANEL
BUN: 13 mg/dL (ref 6–23)
CHLORIDE: 97 meq/L (ref 96–112)
CO2: 27 mEq/L (ref 19–32)
Calcium: 10 mg/dL (ref 8.4–10.5)
Creatinine, Ser: 0.77 mg/dL (ref 0.50–1.10)
GFR calc non Af Amer: 82 mL/min — ABNORMAL LOW (ref >90)
Glucose, Bld: 157 mg/dL — ABNORMAL HIGH (ref 70–99)
Potassium: 4.2 mEq/L (ref 3.7–5.3)
Sodium: 137 mEq/L (ref 137–147)

## 2013-10-08 LAB — HEMOGLOBIN AND HEMATOCRIT, BLOOD
HEMATOCRIT: 38.5 % (ref 36.0–46.0)
HEMOGLOBIN: 13.1 g/dL (ref 12.0–15.0)

## 2013-10-08 NOTE — Pre-Procedure Instructions (Signed)
Patient given information to sign up for my chart at home. 

## 2013-10-08 NOTE — Patient Instructions (Signed)
Your procedure is scheduled on: 10/15/2013  Report to [facility] at  830  AM.  Call this number if you have problems the morning of surgery: [phone]   Do not eat food or drink liquids :After Midnight.      Take these medicines the morning of surgery with A SIP OF WATER: lisinopril, metoprolol   Do not wear jewelry, make-up or nail polish.  Do not wear lotions, powders, or perfumes.   Do not shave 48 hours prior to surgery.  Do not bring valuables to the hospital.  Contacts, dentures or bridgework may not be worn into surgery.  Leave suitcase in the car. After surgery it may be brought to your room.  For patients admitted to the hospital, checkout time is 11:00 AM the day of discharge.   Patients discharged the day of surgery will not be allowed to drive home.  :     Please read over the following fact sheets that you were given: Coughing and Deep Breathing, Surgical Site Infection Prevention, Anesthesia Post-op Instructions and Care and Recovery After Surgery    Cataract A cataract is a clouding of the lens of the eye. When a lens becomes cloudy, vision is reduced based on the degree and nature of the clouding. Many cataracts reduce vision to some degree. Some cataracts make people more near-sighted as they develop. Other cataracts increase glare. Cataracts that are ignored and become worse can sometimes look white. The white color can be seen through the pupil. CAUSES   Aging. However, cataracts may occur at any age, even in newborns.   Certain drugs.   Trauma to the eye.   Certain diseases such as diabetes.   Specific eye diseases such as chronic inflammation inside the eye or a sudden attack of a rare form of glaucoma.   Inherited or acquired medical problems.  SYMPTOMS   Gradual, progressive drop in vision in the affected eye.   Severe, rapid visual loss. This most often happens when trauma is the cause.  DIAGNOSIS  To detect a cataract, an eye doctor examines the  lens. Cataracts are best diagnosed with an exam of the eyes with the pupils enlarged (dilated) by drops.  TREATMENT  For an early cataract, vision may improve by using different eyeglasses or stronger lighting. If that does not help your vision, surgery is the only effective treatment. A cataract needs to be surgically removed when vision loss interferes with your everyday activities, such as driving, reading, or watching TV. A cataract may also have to be removed if it prevents examination or treatment of another eye problem. Surgery removes the cloudy lens and usually replaces it with a substitute lens (intraocular lens, IOL).  At a time when both you and your doctor agree, the cataract will be surgically removed. If you have cataracts in both eyes, only one is usually removed at a time. This allows the operated eye to heal and be out of danger from any possible problems after surgery (such as infection or poor wound healing). In rare cases, a cataract may be doing damage to your eye. In these cases, your caregiver may advise surgical removal right away. The vast majority of people who have cataract surgery have better vision afterward. HOME CARE INSTRUCTIONS  If you are not planning surgery, you may be asked to do the following:  Use different eyeglasses.   Use stronger or brighter lighting.   Ask your eye doctor about reducing your medicine dose or changing medicines  if it is thought that a medicine caused your cataract. Changing medicines does not make the cataract go away on its own.   Become familiar with your surroundings. Poor vision can lead to injury. Avoid bumping into things on the affected side. You are at a higher risk for tripping or falling.   Exercise extreme care when driving or operating machinery.   Wear sunglasses if you are sensitive to bright light or experiencing problems with glare.  SEEK IMMEDIATE MEDICAL CARE IF:   You have a worsening or sudden vision loss.   You  notice redness, swelling, or increasing pain in the eye.   You have a fever.  Document Released: 05/24/2005 Document Revised: 05/13/2011 Document Reviewed: 01/15/2011 [facility] Patient Information 2012 [place].PATIENT INSTRUCTIONS POST-ANESTHESIA  IMMEDIATELY FOLLOWING SURGERY:  Do not drive or operate machinery for the first twenty four hours after surgery.  Do not make any important decisions for twenty four hours after surgery or while taking narcotic pain medications or sedatives.  If you develop intractable nausea and vomiting or a severe headache please notify your doctor immediately.  FOLLOW-UP:  Please make an appointment with your surgeon as instructed. You do not need to follow up with anesthesia unless specifically instructed to do so.  WOUND CARE INSTRUCTIONS (if applicable):  Keep a dry clean dressing on the anesthesia/puncture wound site if there is drainage.  Once the wound has quit draining you may leave it open to air.  Generally you should leave the bandage intact for twenty four hours unless there is drainage.  If the epidural site drains for more than 36-48 hours please call the anesthesia department.  QUESTIONS?:  Please feel free to call your physician or the hospital operator if you have any questions, and they will be happy to assist you.

## 2013-10-15 ENCOUNTER — Ambulatory Visit (HOSPITAL_COMMUNITY): Payer: Medicare Other | Admitting: Anesthesiology

## 2013-10-15 ENCOUNTER — Encounter (HOSPITAL_COMMUNITY): Payer: Medicare Other | Admitting: Anesthesiology

## 2013-10-15 ENCOUNTER — Ambulatory Visit (HOSPITAL_COMMUNITY)
Admission: RE | Admit: 2013-10-15 | Discharge: 2013-10-15 | Disposition: A | Discharge status: Home / Self Care | Payer: Medicare Other | Source: Ambulatory Visit | Attending: Ophthalmology | Admitting: Ophthalmology

## 2013-10-15 ENCOUNTER — Encounter (HOSPITAL_COMMUNITY): Payer: Self-pay

## 2013-10-15 ENCOUNTER — Encounter (HOSPITAL_COMMUNITY)
Admission: RE | Disposition: A | Discharge status: Home / Self Care | Payer: Self-pay | Source: Ambulatory Visit | Attending: Ophthalmology

## 2013-10-15 DIAGNOSIS — F411 Generalized anxiety disorder: Secondary | ICD-10-CM | POA: Insufficient documentation

## 2013-10-15 DIAGNOSIS — E119 Type 2 diabetes mellitus without complications: Secondary | ICD-10-CM | POA: Insufficient documentation

## 2013-10-15 DIAGNOSIS — I10 Essential (primary) hypertension: Secondary | ICD-10-CM | POA: Insufficient documentation

## 2013-10-15 DIAGNOSIS — Z79899 Other long term (current) drug therapy: Secondary | ICD-10-CM | POA: Insufficient documentation

## 2013-10-15 DIAGNOSIS — H269 Unspecified cataract: Secondary | ICD-10-CM | POA: Insufficient documentation

## 2013-10-15 DIAGNOSIS — Z7982 Long term (current) use of aspirin: Secondary | ICD-10-CM | POA: Insufficient documentation

## 2013-10-15 DIAGNOSIS — Z888 Allergy status to other drugs, medicaments and biological substances status: Secondary | ICD-10-CM | POA: Insufficient documentation

## 2013-10-15 HISTORY — PX: CATARACT EXTRACTION W/PHACO: SHX586

## 2013-10-15 LAB — GLUCOSE, CAPILLARY: GLUCOSE-CAPILLARY: 154 mg/dL — AB (ref 70–99)

## 2013-10-15 SURGERY — PHACOEMULSIFICATION, CATARACT, WITH IOL INSERTION
Anesthesia: Monitor Anesthesia Care | Site: Eye | Laterality: Left

## 2013-10-15 MED ORDER — LIDOCAINE HCL (PF) 1 % IJ SOLN
INTRAMUSCULAR | Status: AC
  Filled 2013-10-15: qty 2

## 2013-10-15 MED ORDER — FENTANYL CITRATE 0.05 MG/ML IJ SOLN
25.0000 ug | INTRAMUSCULAR | Status: AC
  Administered 2013-10-15 (×2): 25 ug via INTRAVENOUS

## 2013-10-15 MED ORDER — POVIDONE-IODINE 5 % OP SOLN
OPHTHALMIC | Status: DC | PRN
  Administered 2013-10-15: 1 via OPHTHALMIC

## 2013-10-15 MED ORDER — CYCLOPENTOLATE-PHENYLEPHRINE OP SOLN OPTIME - NO CHARGE
OPHTHALMIC | Status: AC
  Filled 2013-10-15: qty 2

## 2013-10-15 MED ORDER — LIDOCAINE HCL 3.5 % OP GEL
1.0000 | Freq: Once | OPHTHALMIC | Status: AC
Start: 2013-10-15 — End: 2013-10-15
  Administered 2013-10-15: 1 via OPHTHALMIC

## 2013-10-15 MED ORDER — PROVISC 10 MG/ML IO SOLN
INTRAOCULAR | Status: DC | PRN
  Administered 2013-10-15: 0.85 mL via INTRAOCULAR

## 2013-10-15 MED ORDER — TETRACAINE HCL 0.5 % OP SOLN
1.0000 [drp] | OPHTHALMIC | Status: AC
  Administered 2013-10-15 (×3): 1 [drp] via OPHTHALMIC

## 2013-10-15 MED ORDER — LIDOCAINE HCL (PF) 1 % IJ SOLN
INTRAMUSCULAR | Status: DC | PRN
  Administered 2013-10-15: .3 mL

## 2013-10-15 MED ORDER — LIDOCAINE HCL 3.5 % OP GEL
OPHTHALMIC | Status: AC
  Filled 2013-10-15: qty 1

## 2013-10-15 MED ORDER — LACTATED RINGERS IV SOLN
INTRAVENOUS | Status: DC
  Administered 2013-10-15: 09:00:00 via INTRAVENOUS

## 2013-10-15 MED ORDER — MIDAZOLAM HCL 2 MG/2ML IJ SOLN
1.0000 mg | INTRAMUSCULAR | Status: DC | PRN
  Administered 2013-10-15 (×2): 2 mg via INTRAVENOUS
  Filled 2013-10-15: qty 2

## 2013-10-15 MED ORDER — FENTANYL CITRATE 0.05 MG/ML IJ SOLN
INTRAMUSCULAR | Status: AC
  Filled 2013-10-15: qty 2

## 2013-10-15 MED ORDER — MIDAZOLAM HCL 2 MG/2ML IJ SOLN
INTRAMUSCULAR | Status: AC
  Filled 2013-10-15: qty 2

## 2013-10-15 MED ORDER — CYCLOPENTOLATE-PHENYLEPHRINE 0.2-1 % OP SOLN
1.0000 [drp] | OPHTHALMIC | Status: AC
  Administered 2013-10-15 (×3): 1 [drp] via OPHTHALMIC

## 2013-10-15 MED ORDER — BSS IO SOLN
INTRAOCULAR | Status: DC | PRN
  Administered 2013-10-15: 15 mL via INTRAOCULAR

## 2013-10-15 MED ORDER — EPINEPHRINE HCL 1 MG/ML IJ SOLN
INTRAMUSCULAR | Status: AC
  Filled 2013-10-15: qty 1

## 2013-10-15 MED ORDER — TETRACAINE HCL 0.5 % OP SOLN
OPHTHALMIC | Status: AC
  Filled 2013-10-15: qty 2

## 2013-10-15 MED ORDER — NEOMYCIN-POLYMYXIN-DEXAMETH 3.5-10000-0.1 OP SUSP
OPHTHALMIC | Status: AC
  Filled 2013-10-15: qty 5

## 2013-10-15 MED ORDER — PHENYLEPHRINE HCL 2.5 % OP SOLN
1.0000 [drp] | OPHTHALMIC | Status: AC
  Administered 2013-10-15 (×3): 1 [drp] via OPHTHALMIC

## 2013-10-15 MED ORDER — PHENYLEPHRINE HCL 2.5 % OP SOLN
OPHTHALMIC | Status: AC
  Filled 2013-10-15: qty 15

## 2013-10-15 MED ORDER — EPINEPHRINE HCL 1 MG/ML IJ SOLN
INTRAOCULAR | Status: DC | PRN
  Administered 2013-10-15: 10:00:00

## 2013-10-15 MED ORDER — NEOMYCIN-POLYMYXIN-DEXAMETH 3.5-10000-0.1 OP SUSP
OPHTHALMIC | Status: DC | PRN
  Administered 2013-10-15: 2 [drp] via OPHTHALMIC

## 2013-10-15 SURGICAL SUPPLY — 33 items
CAPSULAR TENSION RING-AMO (OPHTHALMIC RELATED) IMPLANT
CLOTH BEACON ORANGE TIMEOUT ST (SAFETY) ×3 IMPLANT
EYE SHIELD UNIVERSAL CLEAR (GAUZE/BANDAGES/DRESSINGS) ×3 IMPLANT
GLOVE BIO SURGEON STRL SZ 6.5 (GLOVE) IMPLANT
GLOVE BIO SURGEONS STRL SZ 6.5 (GLOVE)
GLOVE BIOGEL PI IND STRL 6.5 (GLOVE) IMPLANT
GLOVE BIOGEL PI IND STRL 7.0 (GLOVE) ×1 IMPLANT
GLOVE BIOGEL PI IND STRL 7.5 (GLOVE) ×1 IMPLANT
GLOVE BIOGEL PI INDICATOR 6.5 (GLOVE)
GLOVE BIOGEL PI INDICATOR 7.0 (GLOVE) ×2
GLOVE BIOGEL PI INDICATOR 7.5 (GLOVE) ×2
GLOVE ECLIPSE 6.5 STRL STRAW (GLOVE) IMPLANT
GLOVE ECLIPSE 7.0 STRL STRAW (GLOVE) IMPLANT
GLOVE ECLIPSE 7.5 STRL STRAW (GLOVE) IMPLANT
GLOVE EXAM NITRILE LRG STRL (GLOVE) IMPLANT
GLOVE EXAM NITRILE MD LF STRL (GLOVE) IMPLANT
GLOVE SKINSENSE NS SZ6.5 (GLOVE)
GLOVE SKINSENSE NS SZ7.0 (GLOVE)
GLOVE SKINSENSE STRL SZ6.5 (GLOVE) IMPLANT
GLOVE SKINSENSE STRL SZ7.0 (GLOVE) IMPLANT
KIT VITRECTOMY (OPHTHALMIC RELATED) IMPLANT
PAD ARMBOARD 7.5X6 YLW CONV (MISCELLANEOUS) ×3 IMPLANT
PROC W NO LENS (INTRAOCULAR LENS)
PROC W SPEC LENS (INTRAOCULAR LENS)
PROCESS W NO LENS (INTRAOCULAR LENS) IMPLANT
PROCESS W SPEC LENS (INTRAOCULAR LENS) IMPLANT
RING MALYGIN (MISCELLANEOUS) IMPLANT
SIGHTPATH CAT PROC W REG LENS (Ophthalmic Related) ×3 IMPLANT
SYR TB 1ML LL NO SAFETY (SYRINGE) ×3 IMPLANT
TAPE SURG TRANSPORE 1 IN (GAUZE/BANDAGES/DRESSINGS) ×1 IMPLANT
TAPE SURGICAL TRANSPORE 1 IN (GAUZE/BANDAGES/DRESSINGS) ×2
VISCOELASTIC ADDITIONAL (OPHTHALMIC RELATED) IMPLANT
WATER STERILE IRR 250ML POUR (IV SOLUTION) ×3 IMPLANT

## 2013-10-15 NOTE — Discharge Instructions (Signed)

## 2013-10-15 NOTE — Op Note (Signed)
Date of Admission: 10/15/2013  Date of Surgery: 10/15/2013   Pre-Op Dx: Cataract Left Eye  Post-Op Dx: Cataract Left  Eye,  Dx Code 366.16  Surgeon: Gemma PayorKerry Jiyaan Steinhauser, M.D.  Assistants: None  Anesthesia: Topical with MAC  Indications: Painless, progressive loss of vision with compromise of daily activities.  Surgery: Cataract Extraction with Intraocular lens Implant Left Eye  Discription: The patient had dilating drops and viscous lidocaine placed into the Left eye in the pre-op holding area. After transfer to the operating room, a time out was performed. The patient was then prepped and draped. Beginning with a 75 degree blade a paracentesis port was made at the surgeon's 2 o'clock position. The anterior chamber was then filled with 1% non-preserved lidocaine. This was followed by filling the anterior chamber with Provisc.  A 2.454mm keratome blade was used to make a clear corneal incision at the temporal limbus.  A bent cystatome needle was used to create a continuous tear capsulotomy. Hydrodissection was performed with balanced salt solution on a Fine canula. The lens nucleus was then removed using the phacoemulsification handpiece. Residual cortex was removed with the I&A handpiece. The anterior chamber and capsular bag were refilled with Provisc. A posterior chamber intraocular lens was placed into the capsular bag with it's injector. The implant was positioned with the Kuglan hook. The Provisc was then removed from the anterior chamber and capsular bag with the I&A handpiece. Stromal hydration of the main incision and paracentesis port was performed with BSS on a Fine canula. The wounds were tested for leak which was negative. The patient tolerated the procedure well. There were no operative complications. The patient was then transferred to the recovery room in stable condition.  Complications: None  Specimen: None  EBL: None  Prosthetic device: Hoya iSert 250, power 22.5 D, SN [id number].

## 2013-10-15 NOTE — Transfer of Care (Signed)
Immediate Anesthesia Transfer of Care Note  Patient: Desiree Baxter  Procedure(s) Performed: Procedure(s) with comments: CATARACT EXTRACTION PHACO AND INTRAOCULAR LENS PLACEMENT (IOC) (Left) - CDE 10.56  Patient Location: Short Stay  Anesthesia Type:MAC  Level of Consciousness: awake, alert , oriented and patient cooperative  Airway & Oxygen Therapy: Patient Spontanous Breathing  Post-op Assessment: Report given to PACU RN, Post -op Vital signs reviewed and stable and Patient moving all extremities  Post vital signs: Reviewed and stable  Complications: No apparent anesthesia complications

## 2013-10-15 NOTE — H&P (Signed)
I have reviewed the H&P, the patient was re-examined, and I have identified no interval changes in medical condition and plan of care since the history and physical of record  

## 2013-10-15 NOTE — Anesthesia Preprocedure Evaluation (Signed)
Anesthesia Evaluation  Patient identified by MRN, date of birth, ID band Patient awake    Reviewed: Allergy & Precautions, H&P , NPO status , Patient's Chart, lab work & pertinent test results, reviewed documented beta blocker date and time   History of Anesthesia Complications Negative for: history of anesthetic complications  Airway Mallampati: I TM Distance: >3 FB     Dental  (+) Teeth Intact   Pulmonary neg pulmonary ROS,  breath sounds clear to auscultation        Cardiovascular hypertension, Pt. on medications and Pt. on home beta blockers Rhythm:Regular Rate:Normal     Neuro/Psych PSYCHIATRIC DISORDERS Anxiety    GI/Hepatic   Endo/Other  diabetes, Well Controlled, Type 2, Oral Hypoglycemic Agents  Renal/GU      Musculoskeletal   Abdominal   Peds  Hematology   Anesthesia Other Findings   Reproductive/Obstetrics                           Anesthesia Physical Anesthesia Plan  ASA: III  Anesthesia Plan: MAC   Post-op Pain Management:    Induction: Intravenous  Airway Management Planned: Nasal Cannula  Additional Equipment:   Intra-op Plan:   Post-operative Plan:   Informed Consent: I have reviewed the patients History and Physical, chart, labs and discussed the procedure including the risks, benefits and alternatives for the proposed anesthesia with the patient or authorized representative who has indicated his/her understanding and acceptance.     Plan Discussed with:   Anesthesia Plan Comments:         Anesthesia Quick Evaluation

## 2013-10-15 NOTE — Anesthesia Postprocedure Evaluation (Signed)
  Anesthesia Post-op Note  Patient: Desiree Baxter  Procedure(s) Performed: Procedure(s) with comments: CATARACT EXTRACTION PHACO AND INTRAOCULAR LENS PLACEMENT (IOC) (Left) - CDE 10.56  Patient Location: Short Stay  Anesthesia Type:MAC  Level of Consciousness: awake, alert , oriented and patient cooperative  Airway and Oxygen Therapy: Patient Spontanous Breathing  Post-op Pain: none  Post-op Assessment: Post-op Vital signs reviewed, Patient's Cardiovascular Status Stable, Respiratory Function Stable, Patent Airway and Pain level controlled  Post-op Vital Signs: Reviewed and stable  Last Vitals:  Filed Vitals:   10/15/13 0955  BP: 147/78  Resp: 16    Complications: No apparent anesthesia complications

## 2013-10-16 ENCOUNTER — Encounter (HOSPITAL_COMMUNITY): Payer: Self-pay | Admitting: Ophthalmology

## 2013-11-29 ENCOUNTER — Other Ambulatory Visit: Payer: Self-pay

## 2013-11-29 DIAGNOSIS — Z1231 Encounter for screening mammogram for malignant neoplasm of breast: Secondary | ICD-10-CM

## 2014-01-01 ENCOUNTER — Ambulatory Visit
Admission: RE | Admit: 2014-01-01 | Discharge: 2014-01-01 | Disposition: A | Discharge status: Home / Self Care | Payer: Medicare Other | Source: Ambulatory Visit

## 2014-01-01 DIAGNOSIS — Z1231 Encounter for screening mammogram for malignant neoplasm of breast: Secondary | ICD-10-CM

## 2014-12-04 ENCOUNTER — Other Ambulatory Visit: Payer: Self-pay

## 2014-12-04 DIAGNOSIS — Z1231 Encounter for screening mammogram for malignant neoplasm of breast: Secondary | ICD-10-CM

## 2015-01-07 ENCOUNTER — Ambulatory Visit
Admission: RE | Admit: 2015-01-07 | Discharge: 2015-01-07 | Disposition: A | Discharge status: Home / Self Care | Payer: Medicare Other | Source: Ambulatory Visit

## 2015-01-07 DIAGNOSIS — Z1231 Encounter for screening mammogram for malignant neoplasm of breast: Secondary | ICD-10-CM

## 2015-09-03 ENCOUNTER — Ambulatory Visit (INDEPENDENT_AMBULATORY_CARE_PROVIDER_SITE_OTHER): Payer: Medicare Other | Admitting: Cardiology

## 2015-09-03 ENCOUNTER — Encounter: Payer: Self-pay | Admitting: Cardiology

## 2015-09-03 VITALS — BP 157/79 | HR 85 | Ht 63.0 in | Wt 131.0 lb

## 2015-09-03 DIAGNOSIS — R0789 Other chest pain: Secondary | ICD-10-CM

## 2015-09-03 DIAGNOSIS — R0609 Other forms of dyspnea: Secondary | ICD-10-CM

## 2015-09-03 DIAGNOSIS — I340 Nonrheumatic mitral (valve) insufficiency: Secondary | ICD-10-CM

## 2015-09-03 DIAGNOSIS — I10 Essential (primary) hypertension: Secondary | ICD-10-CM

## 2015-09-03 NOTE — Patient Instructions (Signed)
Your physician recommends that you schedule a follow-up appointment in: 6 weeks with Dr. Branch   Your physician recommends that you continue on your current medications as directed. Please refer to the Current Medication list given to you today.  Thank you for choosing [facility]!    

## 2015-09-03 NOTE — Progress Notes (Signed)
Patient ID: Desiree PiesMelinda H Baxter, female   DOB: 05/20/1941, 75 y.o.   MRN: [id number]     Clinical Summary Desiree Baxter is a 75 y.o.female seen today as a new patient, she is seen for the following medical problems.  1. Chest pain - history of chest pain in the past, previously worked up by Dr Diona BrownerMcDowell. She had an equivocal nuclear stress test followed by a cath which showed normal coronaries in 11/2010. - denies any recent chest pain.    2. HTN - compliant with meds  3. Mitral regurgitation - mild to moderate by echo in 2012  4. SOB - started 2-3 months ago. DOE with walking up her 13 stairs at home, limited by knee pains and SOB. Can also occur with walking her dog. Can have nonspecific chest feeling in her chest, mainly at night with laying down. Does not occur with exertion. No LE edema, no abdominal distension. No orthopnea - can have some cough. No history of smoking. Reports she used to work in [employer] with some potential chemical exposure.     Past Medical History  Diagnosis Date  . Essential hypertension, benign   . Mixed hyperlipidemia   . Type 2 diabetes mellitus     Neuropathy  . Type II or unspecified type diabetes mellitus with neurological manifestations, not stated as uncontrolled   . Lipoma     right lower leg  . Diabetes mellitus      Allergies  Allergen Reactions  . Elavil [Amitriptyline Hcl]     Too sleepy     Current Outpatient Prescriptions  Medication Sig Dispense Refill  . aspirin EC 81 MG tablet Take 81 mg by mouth daily.      Marland Kitchen. atorvastatin (LIPITOR) 10 MG tablet Take 5 mg by mouth daily.    . Cinnamon 500 MG capsule Take 1,000 mg by mouth 2 (two) times daily.     Marland Kitchen. glipiZIDE (GLUCOTROL XL) 10 MG 24 hr tablet Take 10 mg by mouth daily with breakfast.    . lisinopril (PRINIVIL,ZESTRIL) 20 MG tablet Take 20 mg by mouth daily.      . metFORMIN (GLUCOPHAGE) 500 MG tablet Take 1,000 mg by mouth 2 (two) times daily with a meal.     . metoprolol  succinate (TOPROL-XL) 25 MG 24 hr tablet Take 25 mg by mouth daily.    . Multiple Vitamin (MULTIVITAMIN WITH MINERALS) TABS tablet Take 1 tablet by mouth daily.    . Omega-3 Fatty Acids (FISH OIL) 1200 MG CAPS Take 2 capsules by mouth daily.       No current facility-administered medications for this visit.   Facility-Administered Medications Ordered in Other Visits  Medication Dose Route Frequency Provider Last Rate Last Dose  . fentaNYL (SUBLIMAZE) injection 25-50 mcg  25-50 mcg Intravenous Q5 min PRN Laurene FootmanLuis Gonzalez, MD         Past Surgical History  Procedure Laterality Date  . Cataract surgery    . Temporomandibular joint surgery Bilateral 19 yrs ago  . Carpal tunnel release      right wrist  . Cardiac catheterization      4 months ago  . Cataract extraction w/phaco  05/13/2011    Procedure: CATARACT EXTRACTION PHACO AND INTRAOCULAR LENS PLACEMENT (IOC);  Surgeon: Gemma PayorKerry Hunt;  Location: [facility];  Service: Ophthalmology;  Laterality: Right;  CDE=17.48  . Tubal ligation      mmh  . Cataract extraction w/phaco Left 10/15/2013    Procedure: CATARACT EXTRACTION PHACO  AND INTRAOCULAR LENS PLACEMENT (IOC);  Surgeon: Gemma Payor, MD;  Location: [facility];  Service: Ophthalmology;  Laterality: Left;  CDE 10.56     Allergies  Allergen Reactions  . Elavil [Amitriptyline Hcl]     Too sleepy      Family History  Problem Relation Age of Onset  . Cancer Father   . Dementia Mother   . Anesthesia problems Neg Hx   . Hypotension Neg Hx   . Malignant hyperthermia Neg Hx   . Pseudochol deficiency Neg Hx      Social History Desiree Baxter reports that she has never smoked. She has never used smokeless tobacco. Desiree Baxter reports that she does not drink alcohol.   Review of Systems CONSTITUTIONAL: No weight loss, fever, chills, weakness or fatigue.  HEENT: Eyes: No visual loss, blurred vision, double vision or yellow sclerae.No hearing loss, sneezing, congestion, runny nose or sore throat.    SKIN: No rash or itching.  CARDIOVASCULAR: per HPI RESPIRATORY:per HPI GASTROINTESTINAL: No anorexia, nausea, vomiting or diarrhea. No abdominal pain or blood.  GENITOURINARY: No burning on urination, no polyuria NEUROLOGICAL: No headache, dizziness, syncope, paralysis, ataxia, numbness or tingling in the extremities. No change in bowel or bladder control.  MUSCULOSKELETAL: No muscle, back pain, joint pain or stiffness.  LYMPHATICS: No enlarged nodes. No history of splenectomy.  PSYCHIATRIC: No history of depression or anxiety.  ENDOCRINOLOGIC: No reports of sweating, cold or heat intolerance. No polyuria or polydipsia.  Marland Kitchen   Physical Examination Filed Vitals:   09/03/15 0903  BP: 157/79  Pulse: 85   Filed Vitals:   09/03/15 0903  Height:  (1.6 m)  Weight: 131 lb (59.421 kg)    Gen: resting comfortably, no acute distress HEENT: no scleral icterus, pupils equal round and reactive, no palptable cervical adenopathy,  CV: RRR, no m/r/g, no jvd  Resp: Clear to auscultation bilaterally GI: abdomen is soft, non-tender, non-distended, normal bowel sounds, no hepatosplenomegaly MSK: extremities are warm, no edema.  Skin: warm, no rash Neuro:  no focal deficits Psych: appropriate affect   Diagnostic Studies 11/2010 cath HEMODYNAMIC DATA: Aortic pressure is 172/87 with a mean of 124 mmHg, left ventricular pressure is 168 with EDP of 21 mmHg.  ANGIOGRAPHIC DATA: The left coronary arises and distributes normally. The left main coronary artery is relatively short and is normal. The left anterior descending artery appears normal throughout. The left circumflex coronary artery is normal. It gives rise to 3 obtuse marginal vessels. The right coronary artery arises and distributes normally. It is a normal vessel. Left ventricular angiography was performed in the RAO view. This demonstrates normal left ventricular size and contractility. Ejection fraction is estimated at  65%. There was no mitral insufficiency or prolapse.  FINAL INTERPRETATION: 1. Normal coronary anatomy. 2. Normal left ventricular function.  08/2010 echo LVEF 60-65%, diastolic function not desribed, mild to mod MR, mild to mod TR  Assessment and Plan  1. Chest pain - negative cath in 2012, no recent symptoms - EKG in clinic with normal rhythm, no ischemic changes - continue to monitor  2. HTN - elevated in clinic, she reports well controlled at recent pcp visit recently. We will continue to monitor at this time  3. Mitral regurgitation - mild to mod by echo 2012, she reports recent echo with pcp, we will request report and images  4. DOE - unclear etiology. We will obtain results from her recent echo. Pending results likely consider GXT with pulse ox  Arnoldo Lenis, M.D.

## 2015-09-10 ENCOUNTER — Encounter: Payer: Self-pay | Admitting: *Deleted

## 2015-09-10 ENCOUNTER — Telehealth: Payer: Self-pay | Admitting: *Deleted

## 2015-09-10 DIAGNOSIS — R0602 Shortness of breath: Secondary | ICD-10-CM

## 2015-09-10 NOTE — Telephone Encounter (Signed)
-----   Message from Megan SalonVicky T Slaughter sent at 09/10/2015 11:10 AM EDT -----  09/15/15 Sheral Flow(Mon) 11:45 AM    Patient needs to arrive @ 10:45am For registation.   Please tell patient to go to Main Entrance for  Registration.    Thanks.    ----- Message -----    From: Albertine PatriciaStaci T Peyson Postema, CMA    Sent: 09/10/2015  10:48 AM      To: Megan SalonVicky T Slaughter  Can you please order GXT with pulse ox for pt and I will call with date and time. Thank you  Derric Dealmeida

## 2015-09-10 NOTE — Telephone Encounter (Signed)
Pt aware, orders placed for GXT w/pulse ox. Will forward to schedulers

## 2015-09-10 NOTE — Telephone Encounter (Signed)
-----   Message from Lesle ChrisAngela G Hill, LPN sent at 1/6/10964/09/2015  4:51 PM EDT -----   ----- Message -----    From: Antoine PocheJonathan F Branch, MD    Sent: 09/09/2015   4:36 PM      To: Lesle ChrisAngela G Hill, LPN  Please let patient know we got the echo from her pcp and overall it looks good. Please order a GXT with pulse oximetery for SOB, hold Toprol XL the day of the test.  J BrancH MD

## 2015-09-10 NOTE — Telephone Encounter (Signed)
Pt aware, given verbal instructions and mailed pt letter

## 2015-09-15 ENCOUNTER — Ambulatory Visit (HOSPITAL_COMMUNITY): Admission: RE | Admit: 2015-09-15 | Payer: Medicare Other | Source: Ambulatory Visit

## 2015-09-16 ENCOUNTER — Telehealth: Payer: Self-pay | Admitting: Cardiology

## 2015-09-16 NOTE — Telephone Encounter (Signed)
Spoke with pt regarding holding Toprol and metformin the morning of her stress test. Pt voiced understanding

## 2015-09-16 NOTE — Telephone Encounter (Signed)
Pt would like to know if she can take her meds prior to her apt

## 2015-09-23 ENCOUNTER — Ambulatory Visit (HOSPITAL_COMMUNITY)
Admission: RE | Admit: 2015-09-23 | Discharge: 2015-09-23 | Disposition: A | Discharge status: Home / Self Care | Payer: Medicare Other | Source: Ambulatory Visit | Attending: Cardiology | Admitting: Cardiology

## 2015-09-23 DIAGNOSIS — R0602 Shortness of breath: Secondary | ICD-10-CM | POA: Diagnosis present

## 2015-09-23 LAB — EXERCISE TOLERANCE TEST
CHL CUP MPHR: 146 {beats}/min
CHL CUP RESTING HR STRESS: 98 {beats}/min
CSEPED: 1 min
CSEPEDS: 40 s
CSEPEW: 4.6 METS
CSEPPHR: 164 {beats}/min
Percent HR: 112 %
RPE: 14

## 2015-09-24 ENCOUNTER — Telehealth: Payer: Self-pay | Admitting: *Deleted

## 2015-09-24 NOTE — Telephone Encounter (Signed)
Pt aware, routed to pcp 

## 2015-09-24 NOTE — Telephone Encounter (Signed)
-----   Message from Antoine PocheJonathan F Branch, MD sent at 09/24/2015  9:31 AM EDT ----- Stress test findings were mixed, there were some mild abnormalities but no strongly abnormal findings. We will discuss further at our follow up and decide if any further testing is indicated  Dominga FerryJ Branch MD

## 2015-10-10 ENCOUNTER — Encounter: Payer: Self-pay | Admitting: Cardiology

## 2015-10-10 ENCOUNTER — Ambulatory Visit (INDEPENDENT_AMBULATORY_CARE_PROVIDER_SITE_OTHER): Payer: Medicare Other | Admitting: Cardiology

## 2015-10-10 ENCOUNTER — Encounter: Payer: Self-pay | Admitting: *Deleted

## 2015-10-10 VITALS — BP 144/82 | HR 98 | Ht 63.0 in | Wt 137.0 lb

## 2015-10-10 DIAGNOSIS — R0609 Other forms of dyspnea: Secondary | ICD-10-CM | POA: Diagnosis not present

## 2015-10-10 DIAGNOSIS — R0789 Other chest pain: Secondary | ICD-10-CM | POA: Diagnosis not present

## 2015-10-10 DIAGNOSIS — I340 Nonrheumatic mitral (valve) insufficiency: Secondary | ICD-10-CM

## 2015-10-10 DIAGNOSIS — I10 Essential (primary) hypertension: Secondary | ICD-10-CM

## 2015-10-10 NOTE — Progress Notes (Signed)
Patient ID: Desiree Baxter, female   DOB: 1941-05-04, 75 y.o.   MRN: [id number]     Clinical Summary Desiree Baxter is a 75 y.o.female seen today for follow up of the following medical problems.   1. Chest pain - history of chest pain in the past, previously worked up by Dr Diona Browner. She had an equivocal nuclear stress test followed by a cath which showed normal coronaries in 11/2010. - denies any recent chest pain since last visit.    2. HTN - compliant with meds - does not check regularly at home.  3. Mitral regurgitation - mild to moderate by echo in 2012 - repeat echo [facility] 06/2015 with LVEF 60-65%, mild MR, mild to mod TR   4. SOB - started 2-3 months ago. DOE with walking up her 13 stairs at home, limited by knee pains and SOB. Can also occur with walking her dog. Can have nonspecific chest feeling in her chest, mainly at night with laying down. Does not occur with exertion. No LE edema, no abdominal distension. No orthopnea - can have some cough. No history of smoking. Reports she used to work in [employer] with some potential chemical exposure.   - echo Jan 2017 LVEF 60-65%, grade I diastolic dysfunction, mild MR, mild to mod TR,  - since last visit completed a GXT which did show some ST/T changes, no O2 desaturations, exaggerated heart rate response and hypertenstive response. Exercised 40 sec.  - breathing has worsened since last visit. SOB even on level ground now which is new. No recent LE edema. Denies any chest pain, but can have "odd" feeling in her chest.   Past Medical History  Diagnosis Date  . Essential hypertension, benign   . Mixed hyperlipidemia   . Type 2 diabetes mellitus (HCC)     Neuropathy  . Type II or unspecified type diabetes mellitus with neurological manifestations, not stated as uncontrolled   . Lipoma     right lower leg  . Diabetes mellitus      Allergies  Allergen Reactions  . Elavil [Amitriptyline Hcl]     Too sleepy      Current Outpatient Prescriptions  Medication Sig Dispense Refill  . ALPRAZolam (XANAX) 0.25 MG tablet Take 1 tablet by mouth daily as needed.    Marland Kitchen aspirin EC 81 MG tablet Take 81 mg by mouth daily.      Marland Kitchen atorvastatin (LIPITOR) 10 MG tablet Take 5 mg by mouth daily.    Marland Kitchen buPROPion (WELLBUTRIN SR) 100 MG 12 hr tablet Take 1 tablet by mouth 2 (two) times daily.    . Cinnamon 500 MG capsule Take 1,000 mg by mouth 2 (two) times daily.     Marland Kitchen glipiZIDE (GLUCOTROL XL) 10 MG 24 hr tablet Take 10 mg by mouth daily with breakfast.    . lisinopril (PRINIVIL,ZESTRIL) 20 MG tablet Take 20 mg by mouth daily.      . metFORMIN (GLUCOPHAGE) 500 MG tablet Take 1,000 mg by mouth 2 (two) times daily with a meal.     . metoprolol succinate (TOPROL-XL) 25 MG 24 hr tablet Take 25 mg by mouth daily.    . Multiple Vitamin (MULTIVITAMIN WITH MINERALS) TABS tablet Take 1 tablet by mouth daily.    . Omega-3 Fatty Acids (FISH OIL) 1200 MG CAPS Take 1 capsule by mouth daily.     Marland Kitchen omeprazole (PRILOSEC) 20 MG capsule Take 1 capsule by mouth daily.     No  current facility-administered medications for this visit.   Facility-Administered Medications Ordered in Other Visits  Medication Dose Route Frequency Provider Last Rate Last Dose  . fentaNYL (SUBLIMAZE) injection 25-50 mcg  25-50 mcg Intravenous Q5 min PRN Laurene FootmanLuis Gonzalez, MD         Past Surgical History  Procedure Laterality Date  . Cataract surgery    . Temporomandibular joint surgery Bilateral 19 yrs ago  . Carpal tunnel release      right wrist  . Cardiac catheterization      4 months ago  . Cataract extraction w/phaco  05/13/2011    Procedure: CATARACT EXTRACTION PHACO AND INTRAOCULAR LENS PLACEMENT (IOC);  Surgeon: Gemma PayorKerry Hunt;  Location: [facility];  Service: Ophthalmology;  Laterality: Right;  CDE=17.48  . Tubal ligation      mmh  . Cataract extraction w/phaco Left 10/15/2013    Procedure: CATARACT EXTRACTION PHACO AND INTRAOCULAR LENS PLACEMENT (IOC);   Surgeon: Gemma PayorKerry Hunt, MD;  Location: [facility];  Service: Ophthalmology;  Laterality: Left;  CDE 10.56     Allergies  Allergen Reactions  . Elavil [Amitriptyline Hcl]     Too sleepy      Family History  Problem Relation Age of Onset  . Cancer Father   . Dementia Mother   . Anesthesia problems Neg Hx   . Hypotension Neg Hx   . Malignant hyperthermia Neg Hx   . Pseudochol deficiency Neg Hx      Social History Desiree Baxter reports that she has never smoked. She has never used smokeless tobacco. Desiree Baxter reports that she does not drink alcohol.   Review of Systems CONSTITUTIONAL: No weight loss, fever, chills, weakness or fatigue.  HEENT: Eyes: No visual loss, blurred vision, double vision or yellow sclerae.No hearing loss, sneezing, congestion, runny nose or sore throat.  SKIN: No rash or itching.  CARDIOVASCULAR: per HPI RESPIRATORY: per HPI GASTROINTESTINAL: No anorexia, nausea, vomiting or diarrhea. No abdominal pain or blood.  GENITOURINARY: No burning on urination, no polyuria NEUROLOGICAL: No headache, dizziness, syncope, paralysis, ataxia, numbness or tingling in the extremities. No change in bowel or bladder control.  MUSCULOSKELETAL: No muscle, back pain, joint pain or stiffness.  LYMPHATICS: No enlarged nodes. No history of splenectomy.  PSYCHIATRIC: No history of depression or anxiety.  ENDOCRINOLOGIC: No reports of sweating, cold or heat intolerance. No polyuria or polydipsia.  Marland Kitchen.   Physical Examination Filed Vitals:   10/10/15 1104  BP: 144/82  Pulse: 98   Filed Vitals:   10/10/15 1104  Height: 5\' 3"  (1.6 m)  Weight: 137 lb (62.143 kg)    Gen: resting comfortably, no acute distress HEENT: no scleral icterus, pupils equal round and reactive, no palptable cervical adenopathy,  CV: RRR, no m/r/g, no jvd Resp: Clear to auscultation bilaterally GI: abdomen is soft, non-tender, non-distended, normal bowel sounds, no hepatosplenomegaly MSK: extremities are  warm, no edema.  Skin: warm, no rash Neuro:  no focal deficits Psych: appropriate affect   Diagnostic Studies 11/2010 cath HEMODYNAMIC DATA: Aortic pressure is 172/87 with a mean of 124 mmHg, left ventricular pressure is 168 with EDP of 21 mmHg.  ANGIOGRAPHIC DATA: The left coronary arises and distributes normally. The left main coronary artery is relatively short and is normal. The left anterior descending artery appears normal throughout. The left circumflex coronary artery is normal. It gives rise to 3 obtuse marginal vessels. The right coronary artery arises and distributes normally. It is a normal vessel. Left ventricular angiography was performed in  the RAO view. This demonstrates normal left ventricular size and contractility. Ejection fraction is estimated at 65%. There was no mitral insufficiency or prolapse.  FINAL INTERPRETATION: 1. Normal coronary anatomy. 2. Normal left ventricular function.  08/2010 echo LVEF 60-65%, diastolic function not desribed, mild to mod MR, mild to mod TR  09/2015 GXT  Equivocal GXT results. There was approximately 0.5-1 mm upslopingST segment depression noted in leads II, III, and aVF as well as V4 through V6 6. Baseline ST segment abnormalities also present. No chest pain reported. There was exaggerated increase in heart rate as well as hypertensive response. No oxygen desaturation noted with activity (96% in early recovery).  Duke treadmill score technically intermediate risk due to limited exercise time and ST segment changes. Would correlate clinically. There was exaggerated increase in heart rate as well as hypertensive response. No oxygen desaturation noted with activity (96% in early recovery). Duke treadmill score technically intermediate risk due to limited exercise time and ST segment changes. Would correlate clinically.   Assessment and Plan  1. Chest pain - negative cath in 2012, no recent symptoms but worsening SOB.    - borderline changes on recent GXT, we will obtain Lexiscan MPI to better evaluate.   2. HTN - at goal, we will continue current meds  3. Mitral regurgitation -stable in mild range from recent echo done at [facility], continue to monitor.   4. DOE - unclear etiology. Borderline abnormal GXT, f/u Lexiscan.    F/u 6 weeks.       Antoine Poche, M.D.

## 2015-10-10 NOTE — Patient Instructions (Signed)
Your physician recommends that you schedule a follow-up appointment in: 6 WEEKS WITH DR. BRANCH  Your physician recommends that you continue on your current medications as directed. Please refer to the Current Medication list given to you today.  Your physician has requested that you have a lexiscan myoview. For further information please visit [email or web address]. Please follow instruction sheet, as given.  Thank you for choosing [facility]!!

## 2015-10-17 ENCOUNTER — Encounter (HOSPITAL_COMMUNITY)
Admission: RE | Admit: 2015-10-17 | Discharge: 2015-10-17 | Disposition: A | Discharge status: Home / Self Care | Payer: Medicare Other | Source: Ambulatory Visit | Attending: Cardiology | Admitting: Cardiology

## 2015-10-17 ENCOUNTER — Encounter (HOSPITAL_COMMUNITY): Payer: Medicare Other

## 2015-10-17 ENCOUNTER — Inpatient Hospital Stay (HOSPITAL_COMMUNITY): Admission: RE | Admit: 2015-10-17 | Payer: Medicare Other | Source: Ambulatory Visit

## 2015-10-17 ENCOUNTER — Encounter (HOSPITAL_COMMUNITY): Payer: Self-pay

## 2015-10-17 ENCOUNTER — Encounter (HOSPITAL_COMMUNITY): Admission: RE | Admit: 2015-10-17 | Payer: Medicare Other | Source: Ambulatory Visit

## 2015-10-17 DIAGNOSIS — R0609 Other forms of dyspnea: Secondary | ICD-10-CM | POA: Diagnosis not present

## 2015-10-17 LAB — NM MYOCAR MULTI W/SPECT W/WALL MOTION / EF
CHL CUP NUCLEAR SSS: 5
LHR: 0
LV dias vol: 61 mL (ref 46–106)
LVSYSVOL: 19 mL
Peak HR: 120 {beats}/min
Rest HR: 90 {beats}/min
SDS: 5
SRS: 0
TID: 1.12

## 2015-10-17 MED ORDER — REGADENOSON 0.4 MG/5ML IV SOLN
INTRAVENOUS | Status: AC
  Administered 2015-10-17: 0.4 mg via INTRAVENOUS
  Filled 2015-10-17: qty 5

## 2015-10-17 MED ORDER — TECHNETIUM TC 99M SESTAMIBI - CARDIOLITE
10.0000 | Freq: Once | INTRAVENOUS | Status: AC | PRN
  Administered 2015-10-17: 10.1 via INTRAVENOUS

## 2015-10-17 MED ORDER — SODIUM CHLORIDE 0.9% FLUSH
INTRAVENOUS | Status: AC
  Administered 2015-10-17: 10 mL via INTRAVENOUS
  Filled 2015-10-17: qty 10

## 2015-10-17 MED ORDER — TECHNETIUM TC 99M SESTAMIBI GENERIC - CARDIOLITE
30.0000 | Freq: Once | INTRAVENOUS | Status: AC | PRN
  Administered 2015-10-17: 31.3 via INTRAVENOUS

## 2015-11-21 ENCOUNTER — Encounter: Payer: Self-pay | Admitting: Cardiology

## 2015-11-21 ENCOUNTER — Ambulatory Visit (INDEPENDENT_AMBULATORY_CARE_PROVIDER_SITE_OTHER): Payer: Medicare Other | Admitting: Cardiology

## 2015-11-21 VITALS — BP 130/70 | HR 94 | Ht 63.0 in

## 2015-11-21 DIAGNOSIS — I340 Nonrheumatic mitral (valve) insufficiency: Secondary | ICD-10-CM

## 2015-11-21 DIAGNOSIS — I10 Essential (primary) hypertension: Secondary | ICD-10-CM

## 2015-11-21 DIAGNOSIS — R0602 Shortness of breath: Secondary | ICD-10-CM | POA: Diagnosis not present

## 2015-11-21 DIAGNOSIS — R0789 Other chest pain: Secondary | ICD-10-CM | POA: Diagnosis not present

## 2015-11-21 NOTE — Patient Instructions (Signed)
Medication Instructions:  Continue all current medications.  Labwork: NONE  Testing/Procedures: NONE  Follow-Up: Your physician wants you to follow up in: 6 months.  You will receive a reminder letter in the mail one-two months in advance.  If you don't receive a letter, please call our office to schedule the follow up appointment    If you need a refill on your cardiac medications before your next appointment, please call your pharmacy.  

## 2015-11-21 NOTE — Progress Notes (Signed)
Clinical Summary Desiree Baxter is a 75 y.o.female seen today for follow up of the following medical problems.   1. Chest pain - history of chest pain in the past, previously worked up by Dr Diona Browner. She had an equivocal nuclear stress test followed by a cath which showed normal coronaries in 11/2010. - recent GXT for SOB showed nonspecific ST/T changes, f/u lexiscan without ischemia - no recent chest pain   2. HTN - compliant with meds   3. Mitral regurgitation - mild to moderate by echo in 2012 - repeat echo [facility] 06/2015 with LVEF 60-65%, mild MR, mild to mod TR - no significant LE edema. Her SOB appears to be unrelated.    4. SOB - started 2-3 months ago. DOE with walking up her 13 stairs at home, limited by knee pains and SOB. Can also occur with walking her dog. Can have nonspecific chest feeling in her chest, mainly at night with laying down. Does not occur with exertion. No LE edema, no abdominal distension. No orthopnea - can have some cough. No history of smoking. Reports she used to work in [employer] with some potential chemical exposure.   - echo Jan 2017 LVEF 60-65%, grade I diastolic dysfunction, mild MR, mild to mod TR,  - GXT which did show some ST/T changes, no O2 desaturations, exaggerated heart rate response and hypertenstive response. Exercised 40 sec.  - F/u lexiscan negative for ischemia - notes some improvement in breathing. She stopped lisionpril on her own as trial to see if improved.      SH: her husband is also patient of mine Desiree Baxter Past Medical History  Diagnosis Date  . Essential hypertension, benign   . Mixed hyperlipidemia   . Type 2 diabetes mellitus (HCC)     Neuropathy  . Type II or unspecified type diabetes mellitus with neurological manifestations, not stated as uncontrolled   . Lipoma     right lower leg  . Diabetes mellitus      Allergies  Allergen Reactions  . Elavil [Amitriptyline Hcl]     Too  sleepy     Current Outpatient Prescriptions  Medication Sig Dispense Refill  . ALPRAZolam (XANAX) 0.25 MG tablet Take 1 tablet by mouth daily as needed.    Marland Kitchen aspirin EC 81 MG tablet Take 81 mg by mouth daily.      Marland Kitchen atorvastatin (LIPITOR) 10 MG tablet Take 5 mg by mouth daily.    Marland Kitchen buPROPion (WELLBUTRIN SR) 100 MG 12 hr tablet Take 1 tablet by mouth 2 (two) times daily.    . Cinnamon 500 MG capsule Take 1,000 mg by mouth 2 (two) times daily.     Marland Kitchen glipiZIDE (GLUCOTROL XL) 10 MG 24 hr tablet Take 10 mg by mouth daily with breakfast.    . lisinopril (PRINIVIL,ZESTRIL) 20 MG tablet Take 20 mg by mouth daily.      . metFORMIN (GLUCOPHAGE) 500 MG tablet Take 1,000 mg by mouth 2 (two) times daily with a meal.     . metoprolol succinate (TOPROL-XL) 25 MG 24 hr tablet Take 25 mg by mouth daily.    . Multiple Vitamin (MULTIVITAMIN WITH MINERALS) TABS tablet Take 1 tablet by mouth daily.    . Omega-3 Fatty Acids (FISH OIL) 1200 MG CAPS Take 1 capsule by mouth daily.     Marland Kitchen omeprazole (PRILOSEC) 20 MG capsule Take 1 capsule by mouth daily.     No current facility-administered medications  for this visit.   Facility-Administered Medications Ordered in Other Visits  Medication Dose Route Frequency Provider Last Rate Last Dose  . fentaNYL (SUBLIMAZE) injection 25-50 mcg  25-50 mcg Intravenous Q5 min PRN Laurene FootmanLuis Gonzalez, MD         Past Surgical History  Procedure Laterality Date  . Cataract surgery    . Temporomandibular joint surgery Bilateral 19 yrs ago  . Carpal tunnel release      right wrist  . Cardiac catheterization      4 months ago  . Cataract extraction w/phaco  05/13/2011    Procedure: CATARACT EXTRACTION PHACO AND INTRAOCULAR LENS PLACEMENT (IOC);  Surgeon: Gemma PayorKerry Hunt;  Location: [facility];  Service: Ophthalmology;  Laterality: Right;  CDE=17.48  . Tubal ligation      mmh  . Cataract extraction w/phaco Left 10/15/2013    Procedure: CATARACT EXTRACTION PHACO AND INTRAOCULAR LENS PLACEMENT  (IOC);  Surgeon: Gemma PayorKerry Hunt, MD;  Location: [facility];  Service: Ophthalmology;  Laterality: Left;  CDE 10.56     Allergies  Allergen Reactions  . Elavil [Amitriptyline Hcl]     Too sleepy      Family History  Problem Relation Age of Onset  . Cancer Father   . Dementia Mother   . Anesthesia problems Neg Hx   . Hypotension Neg Hx   . Malignant hyperthermia Neg Hx   . Pseudochol deficiency Neg Hx      Social History Desiree Baxter reports that she has never smoked. She has never used smokeless tobacco. Desiree Baxter reports that she does not drink alcohol.   Review of Systems CONSTITUTIONAL: No weight loss, fever, chills, weakness or fatigue.  HEENT: Eyes: No visual loss, blurred vision, double vision or yellow sclerae.No hearing loss, sneezing, congestion, runny nose or sore throat.  SKIN: No rash or itching.  CARDIOVASCULAR: per HPI RESPIRATORY: No shortness of breath, cough or sputum.  GASTROINTESTINAL: No anorexia, nausea, vomiting or diarrhea. No abdominal pain or blood.  GENITOURINARY: No burning on urination, no polyuria NEUROLOGICAL: No headache, dizziness, syncope, paralysis, ataxia, numbness or tingling in the extremities. No change in bowel or bladder control.  MUSCULOSKELETAL: No muscle, back pain, joint pain or stiffness.  LYMPHATICS: No enlarged nodes. No history of splenectomy.  PSYCHIATRIC: No history of depression or anxiety.  ENDOCRINOLOGIC: No reports of sweating, cold or heat intolerance. No polyuria or polydipsia.  Marland Kitchen.   Physical Examination Filed Vitals:   11/21/15 1339  BP: 130/70  Pulse: 94   Filed Vitals:   11/21/15 1339  Height: 5\' 3"  (1.6 m)    Gen: resting comfortably, no acute distress HEENT: no scleral icterus, pupils equal round and reactive, no palptable cervical adenopathy,  CV: RRR, no m/r/g, no jvd Resp: Clear to auscultation bilaterally GI: abdomen is soft, non-tender, non-distended, normal bowel sounds, no hepatosplenomegaly MSK:  extremities are warm, no edema.  Skin: warm, no rash Neuro:  no focal deficits Psych: appropriate affect   Diagnostic Studies 11/2010 cath HEMODYNAMIC DATA: Aortic pressure is 172/87 with a mean of 124 mmHg, left ventricular pressure is 168 with EDP of 21 mmHg.  ANGIOGRAPHIC DATA: The left coronary arises and distributes normally. The left main coronary artery is relatively short and is normal. The left anterior descending artery appears normal throughout. The left circumflex coronary artery is normal. It gives rise to 3 obtuse marginal vessels. The right coronary artery arises and distributes normally. It is a normal vessel. Left ventricular angiography was performed in the RAO view.  This demonstrates normal left ventricular size and contractility. Ejection fraction is estimated at 65%. There was no mitral insufficiency or prolapse.  FINAL INTERPRETATION: 1. Normal coronary anatomy. 2. Normal left ventricular function.  08/2010 echo LVEF 60-65%, diastolic function not desribed, mild to mod MR, mild to mod TR  09/2015 GXT  Equivocal GXT results. There was approximately 0.5-1 mm upslopingST segment depression noted in leads II, III, and aVF as well as V4 through V6 6. Baseline ST segment abnormalities also present. No chest pain reported. There was exaggerated increase in heart rate as well as hypertensive response. No oxygen desaturation noted with activity (96% in early recovery).  Duke treadmill score technically intermediate risk due to limited exercise time and ST segment changes. Would correlate clinically. There was exaggerated increase in heart rate as well as hypertensive response. No oxygen desaturation noted with activity (96% in early recovery). Duke treadmill score technically intermediate risk due to limited exercise time and ST segment changes. Would correlate clinically.  10/2015 Lexiscan MPI  No T wave inversion was noted during stress.  There was no ST  segment deviation noted during stress.  The study is normal. There are no defects consistent with prior infarct or current ischemia  This is a low risk study.  The left ventricular ejection fraction is hyperdynamic (>65%).  Jan 2017 echo LVEF 60-65%, grade I diastolic dysfunction, normal RV, mild MR, mild to mod TR,     Assessment and Plan  1. Chest pain - negative cath in 2012, recent negative lexiscan - no recent symptoms, continue to monitor.   2. HTN - at goal, she will continue current meds - she stopped lisinopril on her own to se if SOB improves, notes some improvement. We will not restart at this time.   3. Mitral regurgitation -stable in mild range from recent echo done [facility],  - we will continue to monitor.    4. SOB - negative cardiac workup, defer potential pulmonary workup to pcp. Consider PFTs.     F/u 6 months  Antoine Poche, M.D.

## 2015-12-22 ENCOUNTER — Other Ambulatory Visit: Payer: Self-pay | Admitting: Internal Medicine

## 2015-12-22 DIAGNOSIS — Z1231 Encounter for screening mammogram for malignant neoplasm of breast: Secondary | ICD-10-CM

## 2016-01-09 ENCOUNTER — Ambulatory Visit
Admission: RE | Admit: 2016-01-09 | Discharge: 2016-01-09 | Disposition: A | Discharge status: Home / Self Care | Payer: Medicare Other | Source: Ambulatory Visit | Attending: Internal Medicine | Admitting: Internal Medicine

## 2016-01-09 DIAGNOSIS — Z1231 Encounter for screening mammogram for malignant neoplasm of breast: Secondary | ICD-10-CM

## 2016-06-18 ENCOUNTER — Ambulatory Visit (INDEPENDENT_AMBULATORY_CARE_PROVIDER_SITE_OTHER): Payer: Medicare Other | Admitting: Cardiology

## 2016-06-18 ENCOUNTER — Encounter: Payer: Self-pay | Admitting: Cardiology

## 2016-06-18 VITALS — BP 124/73 | HR 85 | Ht 63.0 in | Wt 127.0 lb

## 2016-06-18 DIAGNOSIS — R0789 Other chest pain: Secondary | ICD-10-CM | POA: Diagnosis not present

## 2016-06-18 DIAGNOSIS — R0602 Shortness of breath: Secondary | ICD-10-CM | POA: Diagnosis not present

## 2016-06-18 DIAGNOSIS — I340 Nonrheumatic mitral (valve) insufficiency: Secondary | ICD-10-CM | POA: Diagnosis not present

## 2016-06-18 DIAGNOSIS — I10 Essential (primary) hypertension: Secondary | ICD-10-CM | POA: Diagnosis not present

## 2016-06-18 NOTE — Patient Instructions (Signed)

## 2016-06-18 NOTE — Progress Notes (Signed)
Clinical Summary Ms. Schmutz is a 76 y.o.female seen today for follow up of the following medical problems.   1. Chest pain - history of chest pain in the past, previously worked up by Dr Diona Browner. She had an equivocal nuclear stress test followed by a cath which showed normal coronaries in 11/2010. - recent GXT for SOB showed nonspecific ST/T changes, f/u lexiscan without ischemia    - denies any recent chest pain since our last visit    2. HTN - she is compliant with meds   3. Mitral regurgitation - mild to moderate by echo in 2012 - repeat echo [facility] 06/2015 with LVEF 60-65%, mild MR, mild to mod TR   4. SOB - started 2-3 months ago. DOE with walking up her 13 stairs at home, limited by knee pains and SOB. Can also occur with walking her dog. Can have nonspecific chest feeling in her chest, mainly at night with laying down. Does not occur with exertion. No LE edema, no abdominal distension. No orthopnea - can have some cough. No history of smoking. Reports she used to work in [employer] with some potential chemical exposure.   - echo Jan 2017 LVEF 60-65%, grade I diastolic dysfunction, mild MR, mild to mod TR,  - GXT which did show some ST/T changes, no O2 desaturations, exaggerated heart rate response and hypertenstive response. Exercised 40 sec.  - F/u lexiscan negative for ischemia - notes some improvement in breathing. She stopped lisionpril on her own as trial to see if improved.   - still with some SOB at times. She reports PFTs at [facility] showed some COPD. Also had some anemia, received iron treatments. Breathing is somewhat improved.    SH: her husband is also patient of mine [name]   Past Medical History:  Diagnosis Date  . Diabetes mellitus   . Essential hypertension, benign   . Lipoma    right lower leg  . Mixed hyperlipidemia   . Type 2 diabetes mellitus (HCC)    Neuropathy  . Type II or unspecified type  diabetes mellitus with neurological manifestations, not stated as uncontrolled      Allergies  Allergen Reactions  . Elavil [Amitriptyline Hcl]     Too sleepy     Current Outpatient Prescriptions  Medication Sig Dispense Refill  . ALPRAZolam (XANAX) 0.25 MG tablet Take 1 tablet by mouth daily as needed.    Marland Kitchen amLODipine (NORVASC) 10 MG tablet Take 1 tablet by mouth daily.    Marland Kitchen amoxicillin (AMOXIL) 500 MG capsule Take 4 capsules by mouth as directed. Prior to dental work  0  . aspirin EC 81 MG tablet Take 81 mg by mouth daily.      Marland Kitchen atorvastatin (LIPITOR) 10 MG tablet Take 5 mg by mouth daily.    . Cinnamon 500 MG capsule Take 1,000 mg by mouth 2 (two) times daily.     Marland Kitchen glipiZIDE (GLUCOTROL XL) 10 MG 24 hr tablet Take 10 mg by mouth daily with breakfast.    . metFORMIN (GLUCOPHAGE) 500 MG tablet Take 1,000 mg by mouth 2 (two) times daily with a meal. And 1 tablet at lunch    . metoprolol succinate (TOPROL-XL) 25 MG 24 hr tablet Take 25 mg by mouth daily.    . Multiple Vitamin (MULTIVITAMIN WITH MINERALS) TABS tablet Take 1 tablet by mouth daily.    . Omega-3 Fatty Acids (FISH OIL) 1200 MG CAPS Take 1 capsule by  mouth daily.     Marland Kitchen. omeprazole (PRILOSEC) 20 MG capsule Take 1 capsule by mouth daily.     No current facility-administered medications for this visit.    Facility-Administered Medications Ordered in Other Visits  Medication Dose Route Frequency Provider Last Rate Last Dose  . fentaNYL (SUBLIMAZE) injection 25-50 mcg  25-50 mcg Intravenous Q5 min PRN Laurene FootmanLuis Gonzalez, MD         Past Surgical History:  Procedure Laterality Date  . CARDIAC CATHETERIZATION     4 months ago  . CARPAL TUNNEL RELEASE     right wrist  . CATARACT EXTRACTION W/PHACO  05/13/2011   Procedure: CATARACT EXTRACTION PHACO AND INTRAOCULAR LENS PLACEMENT (IOC);  Surgeon: Gemma PayorKerry Hunt;  Location: [facility];  Service: Ophthalmology;  Laterality: Right;  CDE=17.48  . CATARACT EXTRACTION W/PHACO Left 10/15/2013    Procedure: CATARACT EXTRACTION PHACO AND INTRAOCULAR LENS PLACEMENT (IOC);  Surgeon: Gemma PayorKerry Hunt, MD;  Location: [facility];  Service: Ophthalmology;  Laterality: Left;  CDE 10.56  . Cataract surgery    . TEMPOROMANDIBULAR JOINT SURGERY Bilateral 19 yrs ago  . TUBAL LIGATION     mmh     Allergies  Allergen Reactions  . Elavil [Amitriptyline Hcl]     Too sleepy      Family History  Problem Relation Age of Onset  . Cancer Father   . Dementia Mother   . Anesthesia problems Neg Hx   . Hypotension Neg Hx   . Malignant hyperthermia Neg Hx   . Pseudochol deficiency Neg Hx      Social History [name] reports that she has never smoked. She has never used smokeless tobacco. [name] reports that she does not drink alcohol.   Review of Systems CONSTITUTIONAL: No weight loss, fever, chills, weakness or fatigue.  HEENT: Eyes: No visual loss, blurred vision, double vision or yellow sclerae.No hearing loss, sneezing, congestion, runny nose or sore throat.  SKIN: No rash or itching.  CARDIOVASCULAR: per HPI RESPIRATORY: per HPI GASTROINTESTINAL: No anorexia, nausea, vomiting or diarrhea. No abdominal pain or blood.  GENITOURINARY: No burning on urination, no polyuria NEUROLOGICAL: No headache, dizziness, syncope, paralysis, ataxia, numbness or tingling in the extremities. No change in bowel or bladder control.  MUSCULOSKELETAL: No muscle, back pain, joint pain or stiffness.  LYMPHATICS: No enlarged nodes. No history of splenectomy.  PSYCHIATRIC: No history of depression or anxiety.  ENDOCRINOLOGIC: No reports of sweating, cold or heat intolerance. No polyuria or polydipsia.  Marland Kitchen.   Physical Examination Vitals:   06/18/16 1548  BP: 124/73  Pulse: 85   Vitals:   06/18/16 1548  Weight: 127 lb (57.6 kg)  Height: 5\' 3"  (1.6 m)    Gen: resting comfortably, no acute distress HEENT: no scleral icterus, pupils equal round and reactive, no palptable cervical adenopathy,  CV: RRR, no  m/r/g, no jvd Resp: Clear to auscultation bilaterally GI: abdomen is soft, non-tender, non-distended, normal bowel sounds, no hepatosplenomegaly MSK: extremities are warm, no edema.  Skin: warm, no rash Neuro:  no focal deficits Psych: appropriate affect   Diagnostic Studies  11/2010 cath HEMODYNAMIC DATA: Aortic pressure is 172/87 with a mean of 124 mmHg, left ventricular pressure is 168 with EDP of 21 mmHg.  ANGIOGRAPHIC DATA: The left coronary arises and distributes normally. The left main coronary artery is relatively short and is normal. The left anterior descending artery appears normal throughout. The left circumflex coronary artery is normal. It gives rise to 3 obtuse marginal vessels. The  right coronary artery arises and distributes normally. It is a normal vessel. Left ventricular angiography was performed in the RAO view. This demonstrates normal left ventricular size and contractility. Ejection fraction is estimated at 65%. There was no mitral insufficiency or prolapse.  FINAL INTERPRETATION: 1. Normal coronary anatomy. 2. Normal left ventricular function.  08/2010 echo LVEF 60-65%, diastolic function not desribed, mild to mod MR, mild to mod TR  09/2015 GXT  Equivocal GXT results. There was approximately 0.5-1 mm upslopingST segment depression noted in leads II, III, and aVF as well as V4 through V6 6. Baseline ST segment abnormalities also present. No chest pain reported. There was exaggerated increase in heart rate as well as hypertensive response. No oxygen desaturation noted with activity (96% in early recovery).  Duke treadmill score technically intermediate risk due to limited exercise time and ST segment changes. Would correlate clinically. There was exaggerated increase in heart rate as well as hypertensive response. No oxygen desaturation noted with activity (96% in early recovery). Duke treadmill score technically intermediate risk due to limited  exercise time and ST segment changes. Would correlate clinically.  10/2015 Lexiscan MPI  No T wave inversion was noted during stress.  There was no ST segment deviation noted during stress.  The study is normal. There are no defects consistent with prior infarct or current ischemia  This is a low risk study.  The left ventricular ejection fraction is hyperdynamic (>65%).  Jan 2017 echo LVEF 60-65%, grade I diastolic dysfunction, normal RV, mild MR, mild to mod TR,     Assessment and Plan  1. Chest pain - negative cath in 2012, recent negative lexiscan - no recent symptoms - continue to follow clinically  2. HTN - she is at goal, she will continue current meds   3. Mitral regurgitation -stable in mild range from recent echo done at [facility],  - continue to monitor clinically    4. SOB - negative cardiac workup,appers may have been related to anemia and abnormal PFTs    F/u 1 year  Antoine Poche, M.D.

## 2016-06-21 ENCOUNTER — Encounter: Payer: Self-pay | Admitting: *Deleted

## 2017-01-07 ENCOUNTER — Other Ambulatory Visit: Payer: Self-pay | Admitting: Internal Medicine

## 2017-01-07 DIAGNOSIS — Z1231 Encounter for screening mammogram for malignant neoplasm of breast: Secondary | ICD-10-CM

## 2017-01-17 ENCOUNTER — Ambulatory Visit
Admission: RE | Admit: 2017-01-17 | Discharge: 2017-01-17 | Disposition: A | Discharge status: Home / Self Care | Payer: Medicare Other | Source: Ambulatory Visit | Attending: Internal Medicine | Admitting: Internal Medicine

## 2017-01-17 DIAGNOSIS — Z1231 Encounter for screening mammogram for malignant neoplasm of breast: Secondary | ICD-10-CM

## 2017-06-22 ENCOUNTER — Ambulatory Visit: Payer: Medicare Other | Admitting: Cardiology

## 2017-07-15 ENCOUNTER — Other Ambulatory Visit: Payer: Self-pay

## 2017-07-15 ENCOUNTER — Encounter: Payer: Self-pay | Admitting: Cardiology

## 2017-07-15 ENCOUNTER — Encounter: Payer: Self-pay | Admitting: *Deleted

## 2017-07-15 ENCOUNTER — Ambulatory Visit (INDEPENDENT_AMBULATORY_CARE_PROVIDER_SITE_OTHER): Payer: Medicare HMO | Admitting: Cardiology

## 2017-07-15 VITALS — BP 138/77 | HR 81 | Ht 63.0 in | Wt 118.0 lb

## 2017-07-15 DIAGNOSIS — R0789 Other chest pain: Secondary | ICD-10-CM | POA: Diagnosis not present

## 2017-07-15 DIAGNOSIS — I10 Essential (primary) hypertension: Secondary | ICD-10-CM

## 2017-07-15 DIAGNOSIS — I340 Nonrheumatic mitral (valve) insufficiency: Secondary | ICD-10-CM

## 2017-07-15 DIAGNOSIS — R0602 Shortness of breath: Secondary | ICD-10-CM | POA: Diagnosis not present

## 2017-07-15 NOTE — Progress Notes (Signed)
Clinical Summary [name] is a 77 y.o.female seen today for follow up of the following medical problems.   1. Chest pain - history of chest pain in the past, previously worked up by Dr Diona BrownerMcDowell. She had an equivocal nuclear stress test followed by a cath which showed normal coronaries in 11/2010. -  GXT for SOB showed nonspecific ST/T changes, f/u lexiscan without ischemia  - no recent chest pain.   2. HTN - compliant with meds - she reports she had been on lisionpril, she stopped due to her SOB.  - may consider ARB in the future  3. Mitral regurgitation - mild to moderate by echo in 2012 [facility] 06/2015 with LVEF 60-65%, mild MR, mild to mod TR - denies any symptoms.    4. SOB - started 2-3 months ago. DOE with walking up her 13 stairs at home, limited by knee pains and SOB. Can also occur with walking her dog. Can have nonspecific chest feeling in her chest, mainly at night with laying down. Does not occur with exertion. No LE edema, no abdominal distension. No orthopnea - can have some cough. No history of smoking. Reports she used to work in [employer] with some potential chemical exposure.   - echo Jan 2017 LVEF 60-65%, grade I diastolic dysfunction, mild MR, mild to mod TR,  - GXT which did show some ST/T changes, no O2 desaturations, exaggerated heart rate response and hypertenstive response. Exercised 1min 40 sec.  - 5/2017lexiscan negative for ischemia - notes some improvement in breathing. She stopped lisionpril on her own as trial to see if improved.   - still with some SOB at times. [facility]. Also had some anemia, received iron treatments. Breathing is somewhat improved.    - breathing continues to improve since last visit.   SH: her husband is also patient of mine Irving BurtonLemmie Keplinger   Past Medical History:  Diagnosis Date  . Diabetes mellitus   . Essential hypertension, benign   . Lipoma    right lower leg  . Mixed hyperlipidemia   . Type 2 diabetes mellitus (HCC)    Neuropathy  . Type II or unspecified type diabetes mellitus with neurological manifestations, not stated as uncontrolled(250.60)      Allergies  Allergen Reactions  . Elavil [Amitriptyline Hcl]     Too sleepy     Current Outpatient Medications  Medication Sig Dispense Refill  . ALPRAZolam (XANAX) 0.25 MG tablet Take 1 tablet by mouth daily as needed.    Marland Kitchen. amLODipine (NORVASC) 10 MG tablet Take 1 tablet by mouth daily.    Marland Kitchen. amoxicillin (AMOXIL) 500 MG capsule Take 4 capsules by mouth as directed. Prior to dental work  0  . aspirin EC 81 MG tablet Take 81 mg by mouth daily.      Marland Kitchen. atorvastatin (LIPITOR) 10 MG tablet Take 5 mg by mouth daily.    Marland Kitchen. buPROPion (WELLBUTRIN SR) 100 MG 12 hr tablet Take 100 mg by mouth 2 (two) times daily.    . Cinnamon 500 MG capsule Take 1,000 mg by mouth 2 (two) times daily.     . ciprofloxacin (CIPRO) 500 MG tablet Take 500 mg by mouth 2 (two) times daily as needed.    . Flaxseed, Linseed, (FLAX SEED OIL) 1000 MG CAPS Take 1 capsule by mouth 2 (two) times daily.    Marland Kitchen. glipiZIDE (GLUCOTROL XL) 10 MG 24 hr tablet Take  10 mg by mouth daily with breakfast.    . guaiFENesin-codeine (ROBITUSSIN AC) 100-10 MG/5ML syrup Take 5 mLs by mouth 3 (three) times daily as needed for cough.    . metFORMIN (GLUCOPHAGE) 500 MG tablet Take 1,000 mg by mouth 2 (two) times daily with a meal. And 1 tablet at lunch    . metoprolol succinate (TOPROL-XL) 25 MG 24 hr tablet Take 25 mg by mouth daily.    . Multiple Vitamin (MULTIVITAMIN WITH MINERALS) TABS tablet Take 1 tablet by mouth daily.    Marland Kitchen omeprazole (PRILOSEC) 20 MG capsule Take 1 capsule by mouth daily.     No current facility-administered medications for this visit.    Facility-Administered Medications Ordered in Other Visits  Medication Dose Route Frequency Provider Last Rate Last Dose  . fentaNYL (SUBLIMAZE) injection 25-50 mcg  25-50 mcg  Intravenous Q5 min PRN Laurene Footman, MD         Past Surgical History:  Procedure Laterality Date  . CARDIAC CATHETERIZATION     4 months ago  . CARPAL TUNNEL RELEASE     right wrist  . CATARACT EXTRACTION W/PHACO  05/13/2011   Procedure: CATARACT EXTRACTION PHACO AND INTRAOCULAR LENS PLACEMENT (IOC);  Surgeon: Gemma Payor;  Location: [facility];  Service: Ophthalmology;  Laterality: Right;  CDE=17.48  . CATARACT EXTRACTION W/PHACO Left 10/15/2013   Procedure: CATARACT EXTRACTION PHACO AND INTRAOCULAR LENS PLACEMENT (IOC);  Surgeon: Gemma Payor, MD;  Location: [facility];  Service: Ophthalmology;  Laterality: Left;  CDE 10.56  . Cataract surgery    . TEMPOROMANDIBULAR JOINT SURGERY Bilateral 19 yrs ago  . TUBAL LIGATION     mmh     Allergies  Allergen Reactions  . Elavil [Amitriptyline Hcl]     Too sleepy      Family History  Problem Relation Age of Onset  . Cancer Father   . Dementia Mother   . Breast cancer Sister   . Breast cancer Maternal Grandmother   . Breast cancer Sister   . Anesthesia problems Neg Hx   . Hypotension Neg Hx   . Malignant hyperthermia Neg Hx   . Pseudochol deficiency Neg Hx      Social History Ms. Lebo reports that  has never smoked. she has never used smokeless tobacco. Ms. Depaoli reports that she does not drink alcohol.   Review of Systems CONSTITUTIONAL: No weight loss, fever, chills, weakness or fatigue.  HEENT: Eyes: No visual loss, blurred vision, double vision or yellow sclerae.No hearing loss, sneezing, congestion, runny nose or sore throat.  SKIN: No rash or itching.  CARDIOVASCULAR: per hpi RESPIRATORY: per hpi GASTROINTESTINAL: No anorexia, nausea, vomiting or diarrhea. No abdominal pain or blood.  GENITOURINARY: No burning on urination, no polyuria NEUROLOGICAL: No headache, dizziness, syncope, paralysis, ataxia, numbness or tingling in the extremities. No change in bowel or bladder control.  MUSCULOSKELETAL: No muscle, back pain,  joint pain or stiffness.  LYMPHATICS: No enlarged nodes. No history of splenectomy.  PSYCHIATRIC: No history of depression or anxiety.  ENDOCRINOLOGIC: No reports of sweating, cold or heat intolerance. No polyuria or polydipsia.  Marland Kitchen   Physical Examination Vitals:   07/15/17 1122  BP: 138/77  Pulse: 81   Vitals:   07/15/17 1122  Weight: 118 lb (53.5 kg)  Height: 5\' 3"  (1.6 m)    Gen: resting comfortably, no acute distress HEENT: no scleral icterus, pupils equal round and reactive, no palptable cervical adenopathy,  CV: RRR, 2/6 systolic murmur apex, no jvd  Resp: Clear to auscultation bilaterally GI: abdomen is soft, non-tender, non-distended, normal bowel sounds, no hepatosplenomegaly MSK: extremities are warm, no edema.  Skin: warm, no rash Neuro:  no focal deficits Psych: appropriate affect   Diagnostic Studies 11/2010 cath HEMODYNAMIC DATA: Aortic pressure is 172/87 with a mean of 124 mmHg, left ventricular pressure is 168 with EDP of 21 mmHg.  ANGIOGRAPHIC DATA: The left coronary arises and distributes normally. The left main coronary artery is relatively short and is normal. The left anterior descending artery appears normal throughout. The left circumflex coronary artery is normal. It gives rise to 3 obtuse marginal vessels. The right coronary artery arises and distributes normally. It is a normal vessel. Left ventricular angiography was performed in the RAO view. This demonstrates normal left ventricular size and contractility. Ejection fraction is estimated at 65%. There was no mitral insufficiency or prolapse.  FINAL INTERPRETATION: 1. Normal coronary anatomy. 2. Normal left ventricular function.  08/2010 echo LVEF 60-65%, diastolic function not desribed, mild to mod MR, mild to mod TR  09/2015 GXT  Equivocal GXT results. There was approximately 0.5-1 mm upslopingST segment depression noted in leads II, III, and aVF as well as V4 through V6 6.  Baseline ST segment abnormalities also present. No chest pain reported. There was exaggerated increase in heart rate as well as hypertensive response. No oxygen desaturation noted with activity (96% in early recovery).  Duke treadmill score technically intermediate risk due to limited exercise time and ST segment changes. Would correlate clinically. There was exaggerated increase in heart rate as well as hypertensive response. No oxygen desaturation noted with activity (96% in early recovery). Duke treadmill score technically intermediate risk due to limited exercise time and ST segment changes. Would correlate clinically.  10/2015 Lexiscan MPI  No T wave inversion was noted during stress.  There was no ST segment deviation noted during stress.  The study is normal. There are no defects consistent with prior infarct or current ischemia  This is a low risk study.  The left ventricular ejection fraction is hyperdynamic (>65%).  Jan 2017 echo LVEF 60-65%, grade I diastolic dysfunction, normal RV, mild MR, mild to mod TR,     Assessment and Plan  1. Chest pain - negative cath in 2012, recent negative lexiscan - no recent symptoms, we will conitnue to monitor.   2. HTN - manual bp at goal, continue current meds   3. Mitral regurgitation -stable in mild range from recent echo done at [facility],  - no symptoms, we will conitnue to monitor.    4. SOB - negative cardiac workup,appers may have been related to anemia and abnormal PFTs - symptoms improving, continue to monitor.     F/u 1 year      Antoine Poche, M.D.

## 2017-07-15 NOTE — Patient Instructions (Signed)
Medication Instructions:  Your physician recommends that you continue on your current medications as directed. Please refer to the Current Medication list given to you today.  Labwork: NONE  Testing/Procedures: NONE  Follow-Up: Your physician wants you to follow-up in: 1 YEAR WITH DR. BRANCH. You will receive a reminder letter in the mail two months in advance. If you don't receive a letter, please call our office to schedule the follow-up appointment.  Any Other Special Instructions Will Be Listed Below (If Applicable).  If you need a refill on your cardiac medications before your next appointment, please call your pharmacy. 

## 2017-07-20 ENCOUNTER — Encounter: Payer: Self-pay | Admitting: Cardiology

## 2017-12-05 ENCOUNTER — Other Ambulatory Visit: Payer: Self-pay | Admitting: Internal Medicine

## 2017-12-05 DIAGNOSIS — Z1231 Encounter for screening mammogram for malignant neoplasm of breast: Secondary | ICD-10-CM

## 2018-01-18 ENCOUNTER — Ambulatory Visit
Admission: RE | Admit: 2018-01-18 | Discharge: 2018-01-18 | Disposition: A | Discharge status: Home / Self Care | Payer: Medicare Other | Source: Ambulatory Visit | Attending: Internal Medicine | Admitting: Internal Medicine

## 2018-01-18 DIAGNOSIS — Z1231 Encounter for screening mammogram for malignant neoplasm of breast: Secondary | ICD-10-CM

## 2018-10-05 ENCOUNTER — Telehealth: Payer: Self-pay | Admitting: *Deleted

## 2018-10-05 ENCOUNTER — Encounter: Payer: Self-pay | Admitting: Cardiology

## 2018-10-05 NOTE — Telephone Encounter (Signed)
Patient verbally consented for tele-health visits with [facility] and understands that her insurance company will be billed for the encounter.  Aware to have vitals available   

## 2018-10-06 ENCOUNTER — Encounter: Payer: Self-pay | Admitting: Cardiology

## 2018-10-06 ENCOUNTER — Encounter: Payer: Self-pay | Admitting: *Deleted

## 2018-10-06 ENCOUNTER — Telehealth (INDEPENDENT_AMBULATORY_CARE_PROVIDER_SITE_OTHER): Payer: Medicare Other | Admitting: Cardiology

## 2018-10-06 VITALS — BP 130/70 | HR 82 | Temp 98.0°F | Ht 63.0 in | Wt 113.0 lb

## 2018-10-06 DIAGNOSIS — I340 Nonrheumatic mitral (valve) insufficiency: Secondary | ICD-10-CM

## 2018-10-06 DIAGNOSIS — R0602 Shortness of breath: Secondary | ICD-10-CM

## 2018-10-06 DIAGNOSIS — I10 Essential (primary) hypertension: Secondary | ICD-10-CM

## 2018-10-06 DIAGNOSIS — R0789 Other chest pain: Secondary | ICD-10-CM

## 2018-10-06 NOTE — Progress Notes (Signed)
Virtual Visit via Telephone Note   This visit type was conducted due to national recommendations for restrictions regarding the COVID-19 Pandemic (e.g. social distancing) in an effort to limit this patient's exposure and mitigate transmission in our community.  Due to her co-morbid illnesses, this patient is at least at moderate risk for complications without adequate follow up.  This format is felt to be most appropriate for this patient at this time.  The patient did not have access to video technology/had technical difficulties with video requiring transitioning to audio format only (telephone).  All issues noted in this document were discussed and addressed.  No physical exam could be performed with this format.  Please refer to the patient's chart for her  consent to telehealth for [facility].   Date:  10/06/2018   ID:  Desiree Baxter, DOB 1941/01/02, MRN [id number]  Patient Location: Home Provider Location: Home  PCP:  Kirstie Peri, MD  Cardiologist:  Dr Dina Rich Electrophysiologist:  None   Evaluation Performed:  Follow-Up Visit  Chief Complaint:  1 year follow visit  History of Present Illness:    Desiree Baxter is a 78 y.o. female seen today for follow up of the following medical problem.s    1. Chest pain - history of chest pain in the past, previously worked up by Dr Diona Browner. She had an equivocal nuclear stress test followed by a cath which showed normal coronaries in 11/2010. -  GXT for SOB showed nonspecific ST/T changes, f/u lexiscan without ischemia in 2017   - no recent chest pain. No recent SOB   2. HTN - compliant with meds - she reports she had been on lisionpril, she stopped due to her SOB.  - may consider ARB in the future  - compliant with meds    3. Mitral regurgitation - mild to moderate by echo in 2012 [facility] 06/2015 with LVEF 60-65%, mild MR, mild to mod TR  - no recent symptoms.    4. SOB - started 2-3 months  ago. DOE with walking up her 13 stairs at home, limited by knee pains and SOB. Can also occur with walking her dog. Can have nonspecific chest feeling in her chest, mainly at night with laying down. Does not occur with exertion. No LE edema, no abdominal distension. No orthopnea - can have some cough. No history of smoking. Reports she used to work in [employer] with some potential chemical exposure.   - echo Jan 2017 LVEF 60-65%, grade I diastolic dysfunction, mild MR, mild to mod TR,  - GXT which did show some ST/T changes, no O2 desaturations, exaggerated heart rate response and hypertenstive response. Exercised 40 sec.  - 5/2017lexiscan negative for ischemia - notes some improvement in breathing. She stopped lisionpril on her own as trial to see if improved.   - She reports PFTs at [facility] showed some COPD. Also had some anemia, received iron treatments.    - denies any recent SOB/DOE. Overall feels well    SH: her husband is also patient of mine [name]     The patient does not have symptoms concerning for COVID-19 infection (fever, chills, cough, or new shortness of breath).    Past Medical History:  Diagnosis Date  . Diabetes mellitus   . Essential hypertension, benign   . Lipoma    right lower leg  . Mixed hyperlipidemia   . Type 2 diabetes mellitus (HCC)    Neuropathy  .  Type II or unspecified type diabetes mellitus with neurological manifestations, not stated as uncontrolled(250.60)    Past Surgical History:  Procedure Laterality Date  . CARDIAC CATHETERIZATION     4 months ago  . CARPAL TUNNEL RELEASE     right wrist  . CATARACT EXTRACTION W/PHACO  05/13/2011   Procedure: CATARACT EXTRACTION PHACO AND INTRAOCULAR LENS PLACEMENT (IOC);  Surgeon: Gemma Payor;  Location: [facility];  Service: Ophthalmology;  Laterality: Right;  CDE=17.48  . CATARACT EXTRACTION W/PHACO Left 10/15/2013   Procedure: CATARACT EXTRACTION PHACO AND INTRAOCULAR LENS  PLACEMENT (IOC);  Surgeon: Gemma Payor, MD;  Location: [facility];  Service: Ophthalmology;  Laterality: Left;  CDE 10.56  . Cataract surgery    . TEMPOROMANDIBULAR JOINT SURGERY Bilateral 19 yrs ago  . TUBAL LIGATION     mmh     No outpatient medications have been marked as taking for the 10/06/18 encounter (Appointment) with Antoine Poche, MD.     Allergies:   Elavil [amitriptyline hcl]   Social History   Tobacco Use  . Smoking status: Never Smoker  . Smokeless tobacco: Never Used  Substance Use Topics  . Alcohol use: No    Alcohol/week: 0.0 standard drinks  . Drug use: No     Family Hx: The patient's family history includes Breast cancer in her maternal grandmother, sister, and sister; Cancer in her father; Dementia in her mother. There is no history of Anesthesia problems, Hypotension, Malignant hyperthermia, or Pseudochol deficiency.  ROS:   Please see the history of present illness.     All other systems reviewed and are negative.   Prior CV studies:   The following studies were reviewed today:  11/2010 cath HEMODYNAMIC DATA: Aortic pressure is 172/87 with a mean of 124 mmHg, left ventricular pressure is 168 with EDP of 21 mmHg.  ANGIOGRAPHIC DATA: The left coronary arises and distributes normally. The left main coronary artery is relatively short and is normal. The left anterior descending artery appears normal throughout. The left circumflex coronary artery is normal. It gives rise to 3 obtuse marginal vessels. The right coronary artery arises and distributes normally. It is a normal vessel. Left ventricular angiography was performed in the RAO view. This demonstrates normal left ventricular size and contractility. Ejection fraction is estimated at 65%. There was no mitral insufficiency or prolapse.  FINAL INTERPRETATION: 1. Normal coronary anatomy. 2. Normal left ventricular function.  08/2010 echo LVEF 60-65%, diastolic function not desribed,  mild to mod MR, mild to mod TR  09/2015 GXT  Equivocal GXT results. There was approximately 0.5-1 mm upslopingST segment depression noted in leads II, III, and aVF as well as V4 through V6 6. Baseline ST segment abnormalities also present. No chest pain reported. There was exaggerated increase in heart rate as well as hypertensive response. No oxygen desaturation noted with activity (96% in early recovery).  Duke treadmill score technically intermediate risk due to limited exercise time and ST segment changes. Would correlate clinically. There was exaggerated increase in heart rate as well as hypertensive response. No oxygen desaturation noted with activity (96% in early recovery). Duke treadmill score technically intermediate risk due to limited exercise time and ST segment changes. Would correlate clinically.  10/2015 Lexiscan MPI  No T wave inversion was noted during stress.  There was no ST segment deviation noted during stress.  The study is normal. There are no defects consistent with prior infarct or current ischemia  This is a low risk study.  The  left ventricular ejection fraction is hyperdynamic (>65%).  Jan 2017 echo LVEF 60-65%, grade I diastolic dysfunction, normal RV, mild MR, mild to mod TR,   Labs/Other Tests and Data Reviewed:    EKG:  na  Recent Labs: No results found for requested labs within last 8760 hours.   Recent Lipid Panel No results found for: CHOL, TRIG, HDL, CHOLHDL, LDLCALC, LDLDIRECT  Wt Readings from Last 3 Encounters:  07/15/17 118 lb (53.5 kg)  06/18/16 127 lb (57.6 kg)  10/10/15 137 lb (62.1 kg)     Objective:    Vital Signs:   Today's Vitals   10/06/18 1433  BP: 130/70  Pulse: 82  Temp: 98 F (36.7 C)  SpO2: 97%  Weight: 113 lb (51.3 kg)  Height: 5\' 3"  (1.6 m)   Body mass index is 20.02 kg/m.     ASSESSMENT & PLAN:    1. Chest pain - no recent symptoms, prior ischemic tesitng has been benign - continue to monitor   2. HTN -at goal, continue current meds  3. Mitral regurgitation -stable in mild range from recent echo done [facility],  - no recent symptoms, continue to monitor.    4. SOB - negative cardiac workup in the past,appers may have been related to anemia and abnormal PFTs - no recent issues, continue to monitor   Request labs from pcp  COVID-19 Education: The signs and symptoms of COVID-19 were discussed with the patient and how to seek care for testing (follow up with PCP or arrange E-visit).  The importance of social distancing was discussed today.  Time:   Today, I have spent 16 minutes with the patient with telehealth technology discussing the above problems.     Medication Adjustments/Labs and Tests Ordered: Current medicines are reviewed at length with the patient today.  Concerns regarding medicines are outlined above.   Tests Ordered: No orders of the defined types were placed in this encounter.   Medication Changes: No orders of the defined types were placed in this encounter.   Disposition:  Follow up 1 year  Signed, Dina RichBranch, Xsavier Seeley, MD  10/06/2018 8:39 AM    [facility]

## 2018-10-06 NOTE — Patient Instructions (Signed)

## 2018-12-14 ENCOUNTER — Other Ambulatory Visit: Payer: Self-pay | Admitting: Internal Medicine

## 2018-12-14 DIAGNOSIS — Z1231 Encounter for screening mammogram for malignant neoplasm of breast: Secondary | ICD-10-CM

## 2019-01-24 ENCOUNTER — Other Ambulatory Visit: Payer: Self-pay

## 2019-01-24 ENCOUNTER — Ambulatory Visit
Admission: RE | Admit: 2019-01-24 | Discharge: 2019-01-24 | Disposition: A | Discharge status: Home / Self Care | Payer: Medicare Other | Source: Ambulatory Visit | Attending: Internal Medicine | Admitting: Internal Medicine

## 2019-01-24 DIAGNOSIS — Z1231 Encounter for screening mammogram for malignant neoplasm of breast: Secondary | ICD-10-CM

## 2019-06-11 DIAGNOSIS — E7800 Pure hypercholesterolemia, unspecified: Secondary | ICD-10-CM | POA: Diagnosis not present

## 2019-06-11 DIAGNOSIS — E119 Type 2 diabetes mellitus without complications: Secondary | ICD-10-CM | POA: Diagnosis not present

## 2019-06-18 DIAGNOSIS — M79672 Pain in left foot: Secondary | ICD-10-CM | POA: Diagnosis not present

## 2019-06-18 DIAGNOSIS — L110 Acquired keratosis follicularis: Secondary | ICD-10-CM | POA: Diagnosis not present

## 2019-06-18 DIAGNOSIS — M79671 Pain in right foot: Secondary | ICD-10-CM | POA: Diagnosis not present

## 2019-06-18 DIAGNOSIS — E1140 Type 2 diabetes mellitus with diabetic neuropathy, unspecified: Secondary | ICD-10-CM | POA: Diagnosis not present

## 2019-06-18 DIAGNOSIS — I739 Peripheral vascular disease, unspecified: Secondary | ICD-10-CM | POA: Diagnosis not present

## 2019-07-03 DIAGNOSIS — E785 Hyperlipidemia, unspecified: Secondary | ICD-10-CM | POA: Diagnosis not present

## 2019-07-03 DIAGNOSIS — E559 Vitamin D deficiency, unspecified: Secondary | ICD-10-CM | POA: Diagnosis not present

## 2019-07-03 DIAGNOSIS — E1165 Type 2 diabetes mellitus with hyperglycemia: Secondary | ICD-10-CM | POA: Diagnosis not present

## 2019-07-03 DIAGNOSIS — Z79899 Other long term (current) drug therapy: Secondary | ICD-10-CM | POA: Diagnosis not present

## 2019-07-03 DIAGNOSIS — R5383 Other fatigue: Secondary | ICD-10-CM | POA: Diagnosis not present

## 2019-07-03 DIAGNOSIS — Z1211 Encounter for screening for malignant neoplasm of colon: Secondary | ICD-10-CM | POA: Diagnosis not present

## 2019-07-03 DIAGNOSIS — Z299 Encounter for prophylactic measures, unspecified: Secondary | ICD-10-CM | POA: Diagnosis not present

## 2019-07-03 DIAGNOSIS — Z7189 Other specified counseling: Secondary | ICD-10-CM | POA: Diagnosis not present

## 2019-07-03 DIAGNOSIS — Z0000 Encounter for general adult medical examination without abnormal findings: Secondary | ICD-10-CM | POA: Diagnosis not present

## 2019-07-03 DIAGNOSIS — Z6821 Body mass index (BMI) 21.0-21.9, adult: Secondary | ICD-10-CM | POA: Diagnosis not present

## 2019-07-10 DIAGNOSIS — E1140 Type 2 diabetes mellitus with diabetic neuropathy, unspecified: Secondary | ICD-10-CM | POA: Diagnosis not present

## 2019-07-10 DIAGNOSIS — E1159 Type 2 diabetes mellitus with other circulatory complications: Secondary | ICD-10-CM | POA: Diagnosis not present

## 2019-07-16 DIAGNOSIS — E7800 Pure hypercholesterolemia, unspecified: Secondary | ICD-10-CM | POA: Diagnosis not present

## 2019-07-16 DIAGNOSIS — E119 Type 2 diabetes mellitus without complications: Secondary | ICD-10-CM | POA: Diagnosis not present

## 2019-07-30 DIAGNOSIS — J449 Chronic obstructive pulmonary disease, unspecified: Secondary | ICD-10-CM | POA: Diagnosis not present

## 2019-07-30 DIAGNOSIS — I509 Heart failure, unspecified: Secondary | ICD-10-CM | POA: Diagnosis not present

## 2019-07-30 DIAGNOSIS — Z299 Encounter for prophylactic measures, unspecified: Secondary | ICD-10-CM | POA: Diagnosis not present

## 2019-07-30 DIAGNOSIS — E1165 Type 2 diabetes mellitus with hyperglycemia: Secondary | ICD-10-CM | POA: Diagnosis not present

## 2019-07-30 DIAGNOSIS — I10 Essential (primary) hypertension: Secondary | ICD-10-CM | POA: Diagnosis not present

## 2019-07-30 DIAGNOSIS — Z789 Other specified health status: Secondary | ICD-10-CM | POA: Diagnosis not present

## 2019-07-30 DIAGNOSIS — I739 Peripheral vascular disease, unspecified: Secondary | ICD-10-CM | POA: Diagnosis not present

## 2019-07-30 DIAGNOSIS — Z6821 Body mass index (BMI) 21.0-21.9, adult: Secondary | ICD-10-CM | POA: Diagnosis not present

## 2019-08-02 DIAGNOSIS — E1165 Type 2 diabetes mellitus with hyperglycemia: Secondary | ICD-10-CM | POA: Diagnosis not present

## 2019-08-10 DIAGNOSIS — E7800 Pure hypercholesterolemia, unspecified: Secondary | ICD-10-CM | POA: Diagnosis not present

## 2019-08-10 DIAGNOSIS — E119 Type 2 diabetes mellitus without complications: Secondary | ICD-10-CM | POA: Diagnosis not present

## 2019-09-03 DIAGNOSIS — M79674 Pain in right toe(s): Secondary | ICD-10-CM | POA: Diagnosis not present

## 2019-09-03 DIAGNOSIS — M79672 Pain in left foot: Secondary | ICD-10-CM | POA: Diagnosis not present

## 2019-09-03 DIAGNOSIS — E1140 Type 2 diabetes mellitus with diabetic neuropathy, unspecified: Secondary | ICD-10-CM | POA: Diagnosis not present

## 2019-09-03 DIAGNOSIS — I739 Peripheral vascular disease, unspecified: Secondary | ICD-10-CM | POA: Diagnosis not present

## 2019-09-03 DIAGNOSIS — M79671 Pain in right foot: Secondary | ICD-10-CM | POA: Diagnosis not present

## 2019-09-04 DIAGNOSIS — E1165 Type 2 diabetes mellitus with hyperglycemia: Secondary | ICD-10-CM | POA: Diagnosis not present

## 2019-09-07 DIAGNOSIS — E7800 Pure hypercholesterolemia, unspecified: Secondary | ICD-10-CM | POA: Diagnosis not present

## 2019-09-07 DIAGNOSIS — E119 Type 2 diabetes mellitus without complications: Secondary | ICD-10-CM | POA: Diagnosis not present

## 2019-10-05 DIAGNOSIS — E1165 Type 2 diabetes mellitus with hyperglycemia: Secondary | ICD-10-CM | POA: Diagnosis not present

## 2019-10-11 ENCOUNTER — Ambulatory Visit (INDEPENDENT_AMBULATORY_CARE_PROVIDER_SITE_OTHER): Payer: Medicare Other | Admitting: Family Medicine

## 2019-10-11 ENCOUNTER — Encounter: Payer: Self-pay | Admitting: Family Medicine

## 2019-10-11 ENCOUNTER — Other Ambulatory Visit: Payer: Self-pay

## 2019-10-11 ENCOUNTER — Encounter: Payer: Self-pay | Admitting: *Deleted

## 2019-10-11 VITALS — BP 148/64 | HR 86 | Ht 63.0 in | Wt 125.0 lb

## 2019-10-11 DIAGNOSIS — I10 Essential (primary) hypertension: Secondary | ICD-10-CM | POA: Diagnosis not present

## 2019-10-11 DIAGNOSIS — R0602 Shortness of breath: Secondary | ICD-10-CM

## 2019-10-11 DIAGNOSIS — R0789 Other chest pain: Secondary | ICD-10-CM | POA: Diagnosis not present

## 2019-10-11 DIAGNOSIS — I340 Nonrheumatic mitral (valve) insufficiency: Secondary | ICD-10-CM

## 2019-10-11 NOTE — Patient Instructions (Addendum)

## 2019-10-11 NOTE — Progress Notes (Signed)
Cardiology Office Note  Date: 10/11/2019   ID: Desiree Baxter, DOB [birth year], MRN [id number]  PCP:  Kirstie Peri, MD  Cardiologist:  Dina Rich, MD Electrophysiologist:  None   Chief Complaint: Follow-up chest pain, hypertension, mitral regurgitation, shortness of breath,  History of Present Illness: Desiree Baxter is a 79 y.o. female with a history of chest pain, hypertension, mitral regurgitation, shortness of breath.  Last encounter via telemedicine with Dr. Wyline Mood on 10/06/2018.  She denied any recent chest pain or shortness of breath.  She was taking lisinopril but stopped due to shortness of breath.  There was mention of considering ARB in the future.  Most recent echo in 2017 showed mild MR.    Patient states she has been doing well since last visit.  She denies any recent acute illnesses or hospitalizations.  She is received the Covid vaccine.  She denies any recent progressive anginal or exertional symptoms, orthostatic symptoms, DOE/S OB, PND, orthopnea, palpitations, CVA or TIA-like symptoms, or lower extremity edema.  Past Medical History:  Diagnosis Date  . Diabetes mellitus   . Essential hypertension, benign   . Lipoma    right lower leg  . Mixed hyperlipidemia   . Type 2 diabetes mellitus (HCC)    Neuropathy  . Type II or unspecified type diabetes mellitus with neurological manifestations, not stated as uncontrolled(250.60)     Past Surgical History:  Procedure Laterality Date  . CARDIAC CATHETERIZATION     4 months ago  . CARPAL TUNNEL RELEASE     right wrist  . CATARACT EXTRACTION W/PHACO  05/13/2011   Procedure: CATARACT EXTRACTION PHACO AND INTRAOCULAR LENS PLACEMENT (IOC);  Surgeon: Gemma Payor;  Location: [facility];  Service: Ophthalmology;  Laterality: Right;  CDE=17.48  . CATARACT EXTRACTION W/PHACO Left 10/15/2013   Procedure: CATARACT EXTRACTION PHACO AND INTRAOCULAR LENS PLACEMENT (IOC);  Surgeon: Gemma Payor, MD;  Location: [facility];  Service:  Ophthalmology;  Laterality: Left;  CDE 10.56  . Cataract surgery    . TEMPOROMANDIBULAR JOINT SURGERY Bilateral 19 yrs ago  . TUBAL LIGATION     mmh    Current Outpatient Medications  Medication Sig Dispense Refill  . amLODipine (NORVASC) 10 MG tablet Take 1 tablet by mouth daily.    Marland Kitchen amoxicillin (AMOXIL) 500 MG capsule Take 4 capsules by mouth as directed. Prior to dental work  0  . aspirin EC 81 MG tablet Take 81 mg by mouth daily.      Marland Kitchen atorvastatin (LIPITOR) 10 MG tablet Take 5 mg by mouth daily.    . Cinnamon 500 MG capsule Take 1,000 mg by mouth 2 (two) times daily.     . Flaxseed, Linseed, (FLAX SEED OIL) 1000 MG CAPS Take 1 capsule by mouth 2 (two) times daily.    Marland Kitchen glipiZIDE (GLUCOTROL XL) 5 MG 24 hr tablet Take 5 mg by mouth daily.    . metFORMIN (GLUCOPHAGE) 500 MG tablet Take 1,000 mg by mouth 2 (two) times daily with a meal.     . metoprolol succinate (TOPROL-XL) 25 MG 24 hr tablet Take 25 mg by mouth daily.    . Multiple Vitamin (MULTIVITAMIN WITH MINERALS) TABS tablet Take 1 tablet by mouth daily.     No current facility-administered medications for this visit.   Facility-Administered Medications Ordered in Other Visits  Medication Dose Route Frequency Provider Last Rate Last Admin  . fentaNYL (SUBLIMAZE) injection 25-50 mcg  25-50 mcg Intravenous Q5 min PRN Laurene Footman, MD  Allergies:  Elavil [amitriptyline hcl]   Social History: The patient  reports that she has never smoked. She has never used smokeless tobacco. She reports that she does not drink alcohol or use drugs.   Family History: The patient's family history includes Breast cancer in her maternal grandmother, sister, and sister; Cancer in her father; Dementia in her mother.   ROS:  Please see the history of present illness. Otherwise, complete review of systems is positive for none.  All other systems are reviewed and negative.   Physical Exam: VS:  BP (!) 148/64   Pulse 86   Ht 5\' 3"  (1.6 m)    Wt 125 lb (56.7 kg)   SpO2 96%   BMI 22.14 kg/m , BMI Body mass index is 22.14 kg/m.  Wt Readings from Last 3 Encounters:  10/11/19 125 lb (56.7 kg)  10/06/18 113 lb (51.3 kg)  07/15/17 118 lb (53.5 kg)    General: Patient appears comfortable at rest. Neck: Supple, no elevated JVP or carotid bruits, no thyromegaly. Lungs: Clear to auscultation, nonlabored breathing at rest. Cardiac: Regular rate and rhythm, no S3 or significant systolic murmur, no pericardial rub. Extremities: No pitting edema, distal pulses 2+. Skin: Warm and dry. Musculoskeletal: No kyphosis. Neuropsychiatric: Alert and oriented x3, affect grossly appropriate.  ECG:  An ECG dated 10/11/2019 was personally reviewed today and demonstrated:  Normal sinus rhythm rate 85, nonspecific T wave abnormality  Recent Labwork: No results found for requested labs within last 8760 hours.  No results found for: CHOL, TRIG, HDL, CHOLHDL, VLDL, LDLCALC, LDLDIRECT  Other Studies Reviewed Today:   11/2010 cath HEMODYNAMIC DATA: Aortic pressure is 172/87 with a mean of 124 mmHg, left ventricular pressure is 168 with EDP of 21 mmHg.  ANGIOGRAPHIC DATA: The left coronary arises and distributes normally. The left main coronary artery is relatively short and is normal. The left anterior descending artery appears normal throughout. The left circumflex coronary artery is normal. It gives rise to 3 obtuse marginal vessels. The right coronary artery arises and distributes normally. It is a normal vessel. Left ventricular angiography was performed in the RAO view. This demonstrates normal left ventricular size and contractility. Ejection fraction is estimated at 65%. There was no mitral insufficiency or prolapse.  FINAL INTERPRETATION: 1. Normal coronary anatomy. 2. Normal left ventricular function.  08/2010 echo LVEF 60-63%, diastolic function not desribed, mild to mod MR, mild to mod TR  09/2015 GXT  Equivocal GXT  results. There was approximately 0.5-1 mm upslopingST segment depression noted in leads II, III, and aVF as well as V4 through V6 6. Baseline ST segment abnormalities also present. No chest pain reported. There was exaggerated increase in heart rate as well as hypertensive response. No oxygen desaturation noted with activity (96% in early recovery).  Duke treadmill score technically intermediate risk due to limited exercise time and ST segment changes. Would correlate clinically. There was exaggerated increase in heart rate as well as hypertensive response. No oxygen desaturation noted with activity (96% in early recovery). Duke treadmill score technically intermediate risk due to limited exercise time and ST segment changes. Would correlate clinically.  10/2015 Lexiscan MPI  No T wave inversion was noted during stress.  There was no ST segment deviation noted during stress.  The study is normal. There are no defects consistent with prior infarct or current ischemia  This is a low risk study.  The left ventricular ejection fraction is hyperdynamic (>65%).  Jan 2017 echo LVEF 60-65%, grade  I diastolic dysfunction, normal RV, mild MR, mild to mod TR,  Assessment and Plan:  1. Other chest pain   2. Essential hypertension, benign   3. Non-rheumatic mitral regurgitation   4. SOB (shortness of breath)    1. Other chest pain Patient denies any anginal symptoms.  2. Essential hypertension, benign Blood pressure slightly elevated today at 148/64.  Patient states her systolic blood pressures at home usually run around 135.  Continue amlodipine 10 mg daily, Toprol-XL 25 mg daily.  3. Non-rheumatic mitral regurgitation  no recent symptoms.  Last echo in 2017 showed mild MR and mild to moderate TR.  On exam there are no obvious murmurs.  4. SOB (shortness of breath)   Had abnormal PFTs and some anemia in the past for which she had an iron infusion.  She denies any recent dyspnea on  exertion.  5.  Hyperlipidemia Continue atorvastatin 10 mg daily.  Follow-up with Dr. Sherryll Burger  Medication Adjustments/Labs and Tests Ordered: Current medicines are reviewed at length with the patient today.  Concerns regarding medicines are outlined above.   Disposition: Follow-up with Dr. Wyline Mood or APP 1 year Signed, Rennis Harding, NP 10/11/2019 3:44 PM    [facility] [address] [place], [place], [place] [address] Phone: [phone]; Fax: [phone]

## 2019-10-29 DIAGNOSIS — Z299 Encounter for prophylactic measures, unspecified: Secondary | ICD-10-CM | POA: Diagnosis not present

## 2019-10-29 DIAGNOSIS — J449 Chronic obstructive pulmonary disease, unspecified: Secondary | ICD-10-CM | POA: Diagnosis not present

## 2019-10-29 DIAGNOSIS — E1165 Type 2 diabetes mellitus with hyperglycemia: Secondary | ICD-10-CM | POA: Diagnosis not present

## 2019-10-29 DIAGNOSIS — I739 Peripheral vascular disease, unspecified: Secondary | ICD-10-CM | POA: Diagnosis not present

## 2019-10-29 DIAGNOSIS — I10 Essential (primary) hypertension: Secondary | ICD-10-CM | POA: Diagnosis not present

## 2019-10-29 DIAGNOSIS — I509 Heart failure, unspecified: Secondary | ICD-10-CM | POA: Diagnosis not present

## 2019-11-04 DIAGNOSIS — E119 Type 2 diabetes mellitus without complications: Secondary | ICD-10-CM | POA: Diagnosis not present

## 2019-11-04 DIAGNOSIS — E1165 Type 2 diabetes mellitus with hyperglycemia: Secondary | ICD-10-CM | POA: Diagnosis not present

## 2019-11-04 DIAGNOSIS — E7800 Pure hypercholesterolemia, unspecified: Secondary | ICD-10-CM | POA: Diagnosis not present

## 2019-11-19 DIAGNOSIS — I739 Peripheral vascular disease, unspecified: Secondary | ICD-10-CM | POA: Diagnosis not present

## 2019-11-19 DIAGNOSIS — M79672 Pain in left foot: Secondary | ICD-10-CM | POA: Diagnosis not present

## 2019-11-19 DIAGNOSIS — E1140 Type 2 diabetes mellitus with diabetic neuropathy, unspecified: Secondary | ICD-10-CM | POA: Diagnosis not present

## 2019-11-19 DIAGNOSIS — M79674 Pain in right toe(s): Secondary | ICD-10-CM | POA: Diagnosis not present

## 2019-11-19 DIAGNOSIS — M79671 Pain in right foot: Secondary | ICD-10-CM | POA: Diagnosis not present

## 2019-12-05 DIAGNOSIS — E119 Type 2 diabetes mellitus without complications: Secondary | ICD-10-CM | POA: Diagnosis not present

## 2019-12-05 DIAGNOSIS — E7800 Pure hypercholesterolemia, unspecified: Secondary | ICD-10-CM | POA: Diagnosis not present

## 2019-12-05 DIAGNOSIS — E1165 Type 2 diabetes mellitus with hyperglycemia: Secondary | ICD-10-CM | POA: Diagnosis not present

## 2019-12-24 DIAGNOSIS — I739 Peripheral vascular disease, unspecified: Secondary | ICD-10-CM | POA: Diagnosis not present

## 2019-12-26 ENCOUNTER — Other Ambulatory Visit: Payer: Self-pay | Admitting: Internal Medicine

## 2019-12-26 DIAGNOSIS — Z1231 Encounter for screening mammogram for malignant neoplasm of breast: Secondary | ICD-10-CM

## 2020-01-04 DIAGNOSIS — E7800 Pure hypercholesterolemia, unspecified: Secondary | ICD-10-CM | POA: Diagnosis not present

## 2020-01-04 DIAGNOSIS — E119 Type 2 diabetes mellitus without complications: Secondary | ICD-10-CM | POA: Diagnosis not present

## 2020-01-04 DIAGNOSIS — E1165 Type 2 diabetes mellitus with hyperglycemia: Secondary | ICD-10-CM | POA: Diagnosis not present

## 2020-01-24 DIAGNOSIS — J029 Acute pharyngitis, unspecified: Secondary | ICD-10-CM | POA: Diagnosis not present

## 2020-01-24 DIAGNOSIS — Z299 Encounter for prophylactic measures, unspecified: Secondary | ICD-10-CM | POA: Diagnosis not present

## 2020-01-24 DIAGNOSIS — R05 Cough: Secondary | ICD-10-CM | POA: Diagnosis not present

## 2020-01-24 DIAGNOSIS — J449 Chronic obstructive pulmonary disease, unspecified: Secondary | ICD-10-CM | POA: Diagnosis not present

## 2020-01-24 DIAGNOSIS — I509 Heart failure, unspecified: Secondary | ICD-10-CM | POA: Diagnosis not present

## 2020-01-25 ENCOUNTER — Ambulatory Visit: Payer: PRIVATE HEALTH INSURANCE

## 2020-01-28 DIAGNOSIS — E119 Type 2 diabetes mellitus without complications: Secondary | ICD-10-CM | POA: Diagnosis not present

## 2020-01-28 DIAGNOSIS — E7800 Pure hypercholesterolemia, unspecified: Secondary | ICD-10-CM | POA: Diagnosis not present

## 2020-01-30 DIAGNOSIS — E1165 Type 2 diabetes mellitus with hyperglycemia: Secondary | ICD-10-CM | POA: Diagnosis not present

## 2020-01-30 DIAGNOSIS — R05 Cough: Secondary | ICD-10-CM | POA: Diagnosis not present

## 2020-01-30 DIAGNOSIS — Z299 Encounter for prophylactic measures, unspecified: Secondary | ICD-10-CM | POA: Diagnosis not present

## 2020-01-30 DIAGNOSIS — J029 Acute pharyngitis, unspecified: Secondary | ICD-10-CM | POA: Diagnosis not present

## 2020-01-30 DIAGNOSIS — U071 COVID-19: Secondary | ICD-10-CM | POA: Diagnosis not present

## 2020-02-05 DIAGNOSIS — E1165 Type 2 diabetes mellitus with hyperglycemia: Secondary | ICD-10-CM | POA: Diagnosis not present

## 2020-02-15 DIAGNOSIS — Z20822 Contact with and (suspected) exposure to covid-19: Secondary | ICD-10-CM | POA: Diagnosis not present

## 2020-02-26 DIAGNOSIS — Z299 Encounter for prophylactic measures, unspecified: Secondary | ICD-10-CM | POA: Diagnosis not present

## 2020-02-26 DIAGNOSIS — E1165 Type 2 diabetes mellitus with hyperglycemia: Secondary | ICD-10-CM | POA: Diagnosis not present

## 2020-02-26 DIAGNOSIS — J449 Chronic obstructive pulmonary disease, unspecified: Secondary | ICD-10-CM | POA: Diagnosis not present

## 2020-02-26 DIAGNOSIS — I509 Heart failure, unspecified: Secondary | ICD-10-CM | POA: Diagnosis not present

## 2020-02-26 DIAGNOSIS — I10 Essential (primary) hypertension: Secondary | ICD-10-CM | POA: Diagnosis not present

## 2020-02-26 DIAGNOSIS — I739 Peripheral vascular disease, unspecified: Secondary | ICD-10-CM | POA: Diagnosis not present

## 2020-03-06 DIAGNOSIS — E1165 Type 2 diabetes mellitus with hyperglycemia: Secondary | ICD-10-CM | POA: Diagnosis not present

## 2020-03-06 DIAGNOSIS — Z961 Presence of intraocular lens: Secondary | ICD-10-CM | POA: Diagnosis not present

## 2020-03-06 DIAGNOSIS — H524 Presbyopia: Secondary | ICD-10-CM | POA: Diagnosis not present

## 2020-03-06 DIAGNOSIS — Z7984 Long term (current) use of oral hypoglycemic drugs: Secondary | ICD-10-CM | POA: Diagnosis not present

## 2020-03-06 DIAGNOSIS — E119 Type 2 diabetes mellitus without complications: Secondary | ICD-10-CM | POA: Diagnosis not present

## 2020-03-06 DIAGNOSIS — E7800 Pure hypercholesterolemia, unspecified: Secondary | ICD-10-CM | POA: Diagnosis not present

## 2020-03-18 ENCOUNTER — Other Ambulatory Visit: Payer: Self-pay

## 2020-03-18 ENCOUNTER — Ambulatory Visit
Admission: RE | Admit: 2020-03-18 | Discharge: 2020-03-18 | Disposition: A | Discharge status: Home / Self Care | Payer: Medicare Other | Source: Ambulatory Visit | Attending: Internal Medicine | Admitting: Internal Medicine

## 2020-03-18 DIAGNOSIS — Z1231 Encounter for screening mammogram for malignant neoplasm of breast: Secondary | ICD-10-CM

## 2020-04-04 DIAGNOSIS — E7800 Pure hypercholesterolemia, unspecified: Secondary | ICD-10-CM | POA: Diagnosis not present

## 2020-04-04 DIAGNOSIS — E119 Type 2 diabetes mellitus without complications: Secondary | ICD-10-CM | POA: Diagnosis not present

## 2020-04-05 DIAGNOSIS — E1165 Type 2 diabetes mellitus with hyperglycemia: Secondary | ICD-10-CM | POA: Diagnosis not present

## 2020-05-06 DIAGNOSIS — E7800 Pure hypercholesterolemia, unspecified: Secondary | ICD-10-CM | POA: Diagnosis not present

## 2020-05-06 DIAGNOSIS — E119 Type 2 diabetes mellitus without complications: Secondary | ICD-10-CM | POA: Diagnosis not present

## 2020-05-06 DIAGNOSIS — E1165 Type 2 diabetes mellitus with hyperglycemia: Secondary | ICD-10-CM | POA: Diagnosis not present

## 2020-05-29 DIAGNOSIS — I739 Peripheral vascular disease, unspecified: Secondary | ICD-10-CM | POA: Diagnosis not present

## 2020-05-29 DIAGNOSIS — I10 Essential (primary) hypertension: Secondary | ICD-10-CM | POA: Diagnosis not present

## 2020-05-29 DIAGNOSIS — E1165 Type 2 diabetes mellitus with hyperglycemia: Secondary | ICD-10-CM | POA: Diagnosis not present

## 2020-05-29 DIAGNOSIS — Z299 Encounter for prophylactic measures, unspecified: Secondary | ICD-10-CM | POA: Diagnosis not present

## 2020-05-29 DIAGNOSIS — J449 Chronic obstructive pulmonary disease, unspecified: Secondary | ICD-10-CM | POA: Diagnosis not present

## 2020-06-05 DIAGNOSIS — E7800 Pure hypercholesterolemia, unspecified: Secondary | ICD-10-CM | POA: Diagnosis not present

## 2020-06-05 DIAGNOSIS — Z299 Encounter for prophylactic measures, unspecified: Secondary | ICD-10-CM | POA: Diagnosis not present

## 2020-06-05 DIAGNOSIS — E119 Type 2 diabetes mellitus without complications: Secondary | ICD-10-CM | POA: Diagnosis not present

## 2020-10-13 ENCOUNTER — Ambulatory Visit: Payer: PRIVATE HEALTH INSURANCE | Admitting: Cardiology

## 2020-10-30 ENCOUNTER — Encounter: Payer: Self-pay | Admitting: *Deleted

## 2020-10-30 ENCOUNTER — Encounter: Payer: Self-pay | Admitting: Cardiology

## 2020-10-30 ENCOUNTER — Ambulatory Visit: Payer: Medicare Other | Admitting: Cardiology

## 2020-10-30 VITALS — BP 124/58 | HR 94 | Ht 63.0 in | Wt 121.8 lb

## 2020-10-30 DIAGNOSIS — I10 Essential (primary) hypertension: Secondary | ICD-10-CM | POA: Diagnosis not present

## 2020-10-30 DIAGNOSIS — R0789 Other chest pain: Secondary | ICD-10-CM | POA: Diagnosis not present

## 2020-10-30 DIAGNOSIS — R0602 Shortness of breath: Secondary | ICD-10-CM

## 2020-10-30 NOTE — Patient Instructions (Signed)
Medication Instructions:  Continue all current medications.   Labwork: none  Testing/Procedures: none  Follow-Up: 6 months   Any Other Special Instructions Will Be Listed Below (If Applicable).   If you need a refill on your cardiac medications before your next appointment, please call your pharmacy.  

## 2020-10-30 NOTE — Progress Notes (Signed)
Clinical Summary Ms. Sliker is a 80 y.o.female seen today for follow up of the following medical problem.s    1. Chest pain - history of chest pain in the past, previously worked up by Dr Diona Browner. She had an equivocal nuclear stress test followed by a cath which showed normal coronaries in 11/2010. - GXT for SOB showed nonspecific ST/T changes, f/u lexiscan without ischemia in 2017   -denies any chest pains    2. HTN - compliant with meds - she reports she had been on lisionpril, she stopped due to her SOB.  -mayconsider ARBin the future  - compliant with meds    3. Mitral regurgitation - mild to moderate by echo in 2012 [facility] 06/2015 with LVEF 60-65%, mild MR, mild to mod TR  - no recent symptoms.    4. SOB - started 2-3 months ago. DOE with walking up her 13 stairs at home, limited by knee pains and SOB. Can also occur with walking her dog. Can have nonspecific chest feeling in her chest, mainly at night with laying down. Does not occur with exertion. No LE edema, no abdominal distension. No orthopnea - can have some cough. No history of smoking. Reports she used to work in [employer] with some potential chemical exposure.   - echo Jan 2017 LVEF 60-65%, grade I diastolic dysfunction, mild MR, mild to mod TR,  - GXT which did show some ST/T changes, no O2 desaturations, exaggerated heart rate response and hypertenstive response. Exercised 40 sec.  -5/2017lexiscan negative for ischemia - notes some improvement in breathing. She stopped lisionpril on her own as trial to see if improved.   - She reports PFTs at [facility] showed some COPD. Also had some anemia, received iron treatments.     - some SOB with activities x 2 flights of stairs. Overall stable   SH: her husband is also patient of mine [name]   Past Medical History:  Diagnosis Date  . Diabetes mellitus   . Essential hypertension, benign   .  Lipoma    right lower leg  . Mixed hyperlipidemia   . Type 2 diabetes mellitus (HCC)    Neuropathy  . Type II or unspecified type diabetes mellitus with neurological manifestations, not stated as uncontrolled(250.60)      Allergies  Allergen Reactions  . Elavil [Amitriptyline Hcl]     Too sleepy     Current Outpatient Medications  Medication Sig Dispense Refill  . amLODipine (NORVASC) 10 MG tablet Take 1 tablet by mouth daily.    Marland Kitchen amoxicillin (AMOXIL) 500 MG capsule Take 4 capsules by mouth as directed. Prior to dental work  0  . aspirin EC 81 MG tablet Take 81 mg by mouth daily.      Marland Kitchen atorvastatin (LIPITOR) 10 MG tablet Take 5 mg by mouth daily.    . Cinnamon 500 MG capsule Take 1,000 mg by mouth 2 (two) times daily.     . Flaxseed, Linseed, (FLAX SEED OIL) 1000 MG CAPS Take 1 capsule by mouth 2 (two) times daily.    Marland Kitchen glipiZIDE (GLUCOTROL XL) 5 MG 24 hr tablet Take 5 mg by mouth daily.    . metFORMIN (GLUCOPHAGE) 500 MG tablet Take 1,000 mg by mouth 2 (two) times daily with a meal.     . metoprolol succinate (TOPROL-XL) 25 MG 24 hr tablet Take 25 mg by mouth daily.    . Multiple Vitamin (MULTIVITAMIN WITH MINERALS) TABS  tablet Take 1 tablet by mouth daily.     No current facility-administered medications for this visit.   Facility-Administered Medications Ordered in Other Visits  Medication Dose Route Frequency Provider Last Rate Last Admin  . fentaNYL (SUBLIMAZE) injection 25-50 mcg  25-50 mcg Intravenous Q5 min PRN Laurene Footman, MD         Past Surgical History:  Procedure Laterality Date  . CARDIAC CATHETERIZATION     4 months ago  . CARPAL TUNNEL RELEASE     right wrist  . CATARACT EXTRACTION W/PHACO  05/13/2011   Procedure: CATARACT EXTRACTION PHACO AND INTRAOCULAR LENS PLACEMENT (IOC);  Surgeon: Gemma Payor;  Location: [facility];  Service: Ophthalmology;  Laterality: Right;  CDE=17.48  . CATARACT EXTRACTION W/PHACO Left 10/15/2013   Procedure: CATARACT EXTRACTION  PHACO AND INTRAOCULAR LENS PLACEMENT (IOC);  Surgeon: Gemma Payor, MD;  Location: [facility];  Service: Ophthalmology;  Laterality: Left;  CDE 10.56  . Cataract surgery    . TEMPOROMANDIBULAR JOINT SURGERY Bilateral 19 yrs ago  . TUBAL LIGATION     mmh     Allergies  Allergen Reactions  . Elavil [Amitriptyline Hcl]     Too sleepy      Family History  Problem Relation Age of Onset  . Cancer Father   . Dementia Mother   . Breast cancer Sister   . Breast cancer Maternal Grandmother   . Breast cancer Sister   . Anesthesia problems Neg Hx   . Hypotension Neg Hx   . Malignant hyperthermia Neg Hx   . Pseudochol deficiency Neg Hx      Social History Ms. Hollett reports that she has never smoked. She has never used smokeless tobacco. Ms. Paccione reports no history of alcohol use.   Review of Systems CONSTITUTIONAL: No weight loss, fever, chills, weakness or fatigue.  HEENT: Eyes: No visual loss, blurred vision, double vision or yellow sclerae.No hearing loss, sneezing, congestion, runny nose or sore throat.  SKIN: No rash or itching.  CARDIOVASCULAR: per hpi RESPIRATORY: No shortness of breath, cough or sputum.  GASTROINTESTINAL: No anorexia, nausea, vomiting or diarrhea. No abdominal pain or blood.  GENITOURINARY: No burning on urination, no polyuria NEUROLOGICAL: No headache, dizziness, syncope, paralysis, ataxia, numbness or tingling in the extremities. No change in bowel or bladder control.  MUSCULOSKELETAL: No muscle, back pain, joint pain or stiffness.  LYMPHATICS: No enlarged nodes. No history of splenectomy.  PSYCHIATRIC: No history of depression or anxiety.  ENDOCRINOLOGIC: No reports of sweating, cold or heat intolerance. No polyuria or polydipsia.  Marland Kitchen   Physical Examination Today's Vitals   10/30/20 1412  BP: (!) 124/58  Pulse: 94  SpO2: 98%  Weight: 121 lb 12.8 oz (55.2 kg)  Height: 5\' 3"  (1.6 m)   Body mass index is 21.58 kg/m.  Gen: resting comfortably, no  acute distress HEENT: no scleral icterus, pupils equal round and reactive, no palptable cervical adenopathy,  CV: RRR, no m/r/g, no jvd Resp: Clear to auscultation bilaterally GI: abdomen is soft, non-tender, non-distended, normal bowel sounds, no hepatosplenomegaly MSK: extremities are warm, no edema.  Skin: warm, no rash Neuro:  no focal deficits Psych: appropriate affect   Diagnostic Studies 11/2010 cath HEMODYNAMIC DATA: Aortic pressure is 172/87 with a mean of 124 mmHg, left ventricular pressure is 168 with EDP of 21 mmHg.  ANGIOGRAPHIC DATA: The left coronary arises and distributes normally. The left main coronary artery is relatively short and is normal. The left anterior descending artery appears normal  throughout. The left circumflex coronary artery is normal. It gives rise to 3 obtuse marginal vessels. The right coronary artery arises and distributes normally. It is a normal vessel. Left ventricular angiography was performed in the RAO view. This demonstrates normal left ventricular size and contractility. Ejection fraction is estimated at 65%. There was no mitral insufficiency or prolapse.  FINAL INTERPRETATION: 1. Normal coronary anatomy. 2. Normal left ventricular function.  08/2010 echo LVEF 60-65%, diastolic function not desribed, mild to mod MR, mild to mod TR  09/2015 GXT  Equivocal GXT results. There was approximately 0.5-1 mm upslopingST segment depression noted in leads II, III, and aVF as well as V4 through V6 6. Baseline ST segment abnormalities also present. No chest pain reported. There was exaggerated increase in heart rate as well as hypertensive response. No oxygen desaturation noted with activity (96% in early recovery).  Duke treadmill score technically intermediate risk due to limited exercise time and ST segment changes. Would correlate clinically. There was exaggerated increase in heart rate as well as hypertensive response. No oxygen  desaturation noted with activity (96% in early recovery). Duke treadmill score technically intermediate risk due to limited exercise time and ST segment changes. Would correlate clinically.  10/2015 Lexiscan MPI  No T wave inversion was noted during stress.  There was no ST segment deviation noted during stress.  The study is normal. There are no defects consistent with prior infarct or current ischemia  This is a low risk study.  The left ventricular ejection fraction is hyperdynamic (>65%).  Jan 2017 echo LVEF 60-65%, grade I diastolic dysfunction, normal RV, mild MR, mild to mod TR,    Assessment and Plan   1. Chest pain - no symptoms, cotinue to monitor  2. HTN -she is at goal,continue current meds  3.SOB - negative cardiac workup in the past,appers may have been related to anemia and abnormal PFTs - mild symptoms at times, still tolerates high levels of exertion based on age - monitor at this time, unless significant progression would not repeat cardiac workup  EKG shows NSR, no ischemic changes     Antoine Poche, M.D.

## 2020-10-30 NOTE — Addendum Note (Signed)
Addended by: Lesle Chris on: 10/30/2020 04:17 PM   Modules accepted: Orders

## 2021-02-16 ENCOUNTER — Other Ambulatory Visit: Payer: Self-pay | Admitting: Internal Medicine

## 2021-02-16 DIAGNOSIS — Z1231 Encounter for screening mammogram for malignant neoplasm of breast: Secondary | ICD-10-CM

## 2021-03-20 ENCOUNTER — Ambulatory Visit
Admission: RE | Admit: 2021-03-20 | Discharge: 2021-03-20 | Disposition: A | Discharge status: Home / Self Care | Payer: Medicare Other | Source: Ambulatory Visit | Attending: Internal Medicine | Admitting: Internal Medicine

## 2021-03-20 ENCOUNTER — Other Ambulatory Visit: Payer: Self-pay

## 2021-03-20 DIAGNOSIS — Z1231 Encounter for screening mammogram for malignant neoplasm of breast: Secondary | ICD-10-CM

## 2021-04-25 IMAGING — MG DIGITAL SCREENING BILAT W/ TOMO W/ CAD
6 of 12 series · 6 of 36 positions shown · non-contrast
Comparison: Previous exam(s).

CLINICAL DATA: Screening.

EXAM:
DIGITAL SCREENING BILATERAL MAMMOGRAM WITH TOMO AND CAD

[R XCCL synth-2D]
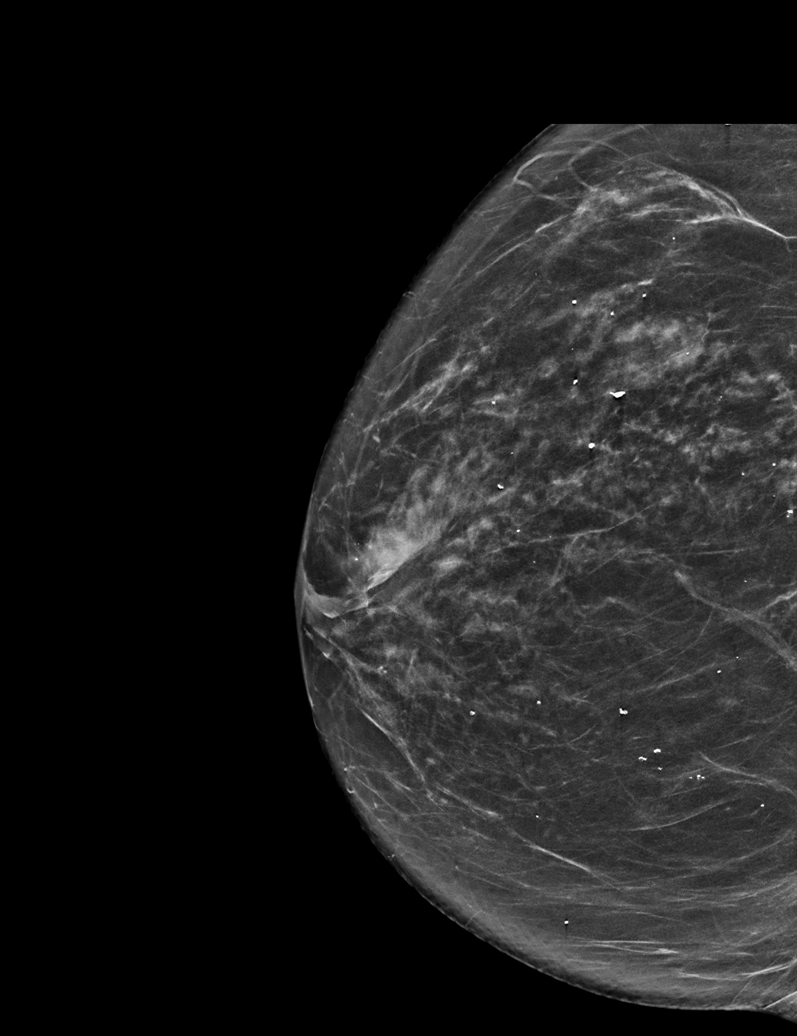

[R MLO synth-2D (1 of 2)]
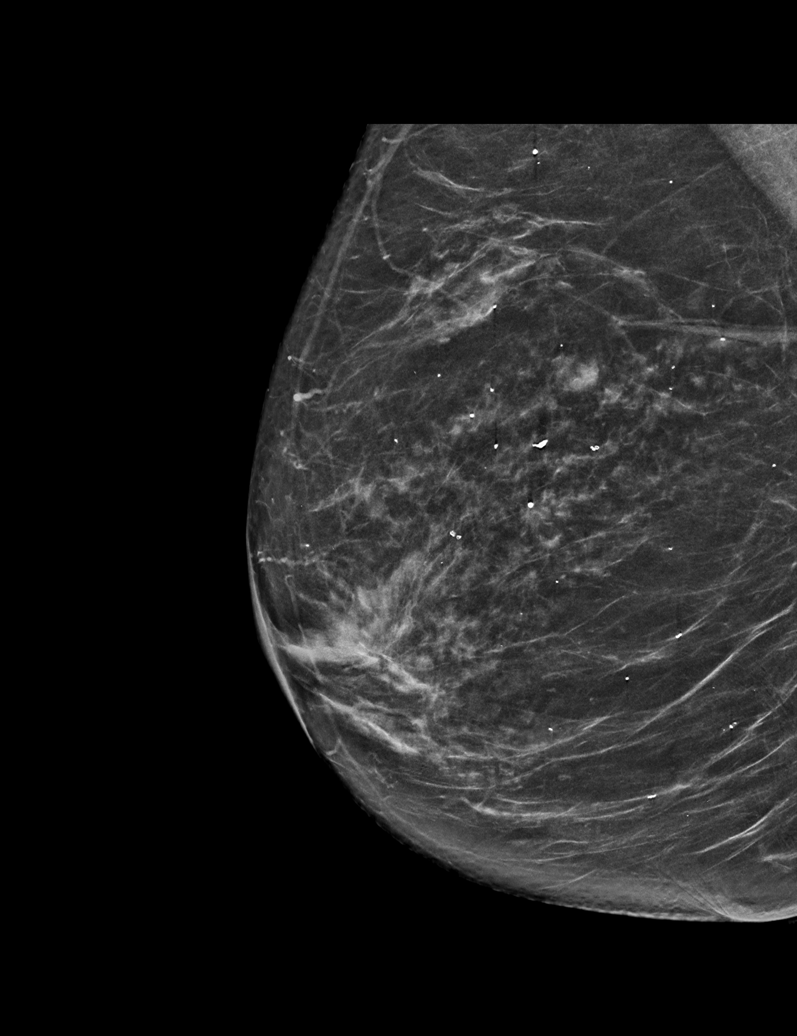

[R MLO synth-2D (2 of 2)]
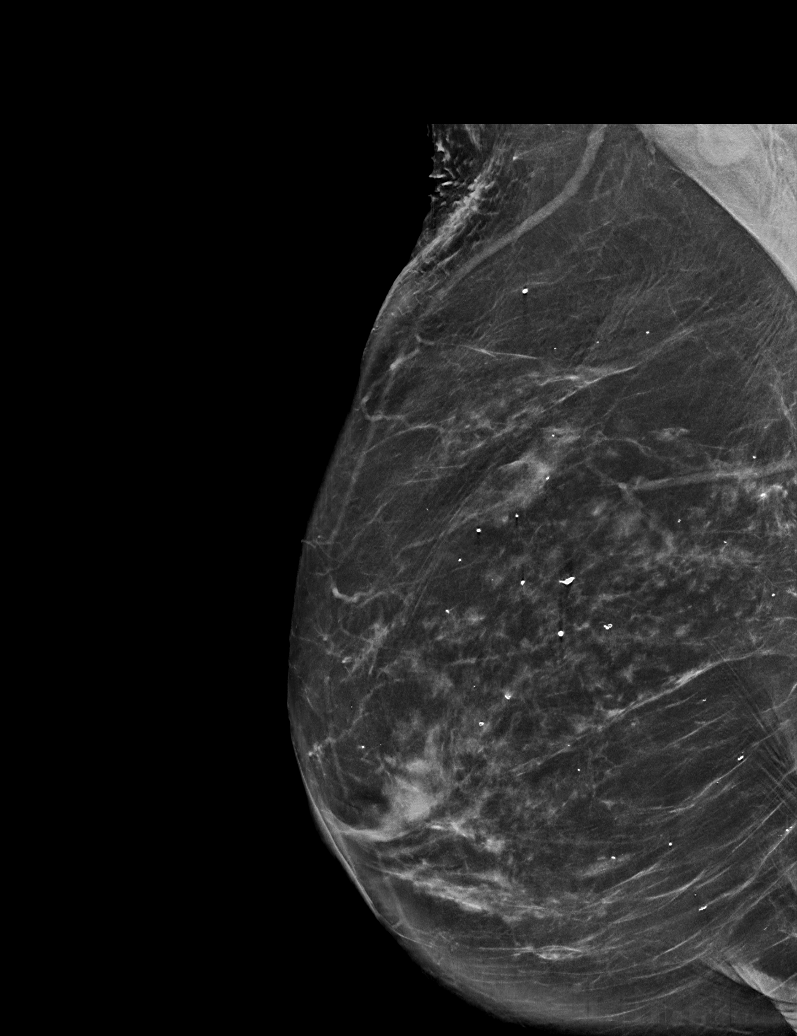

[L CC synth-2D]
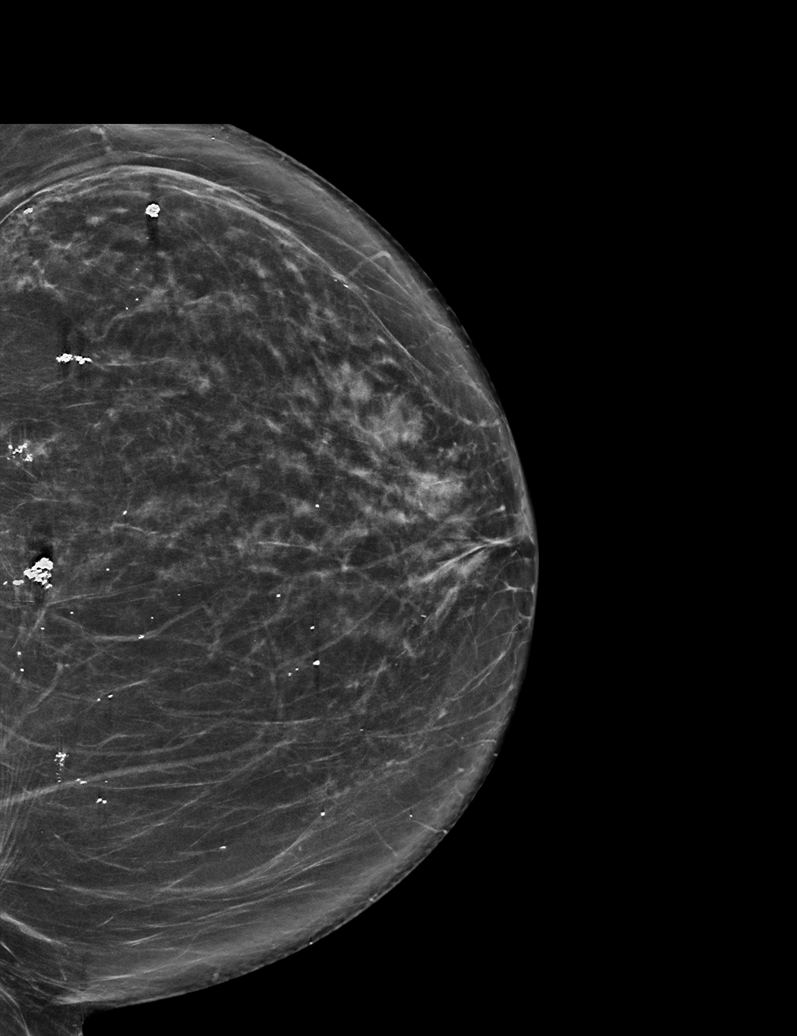

[R CC synth-2D]
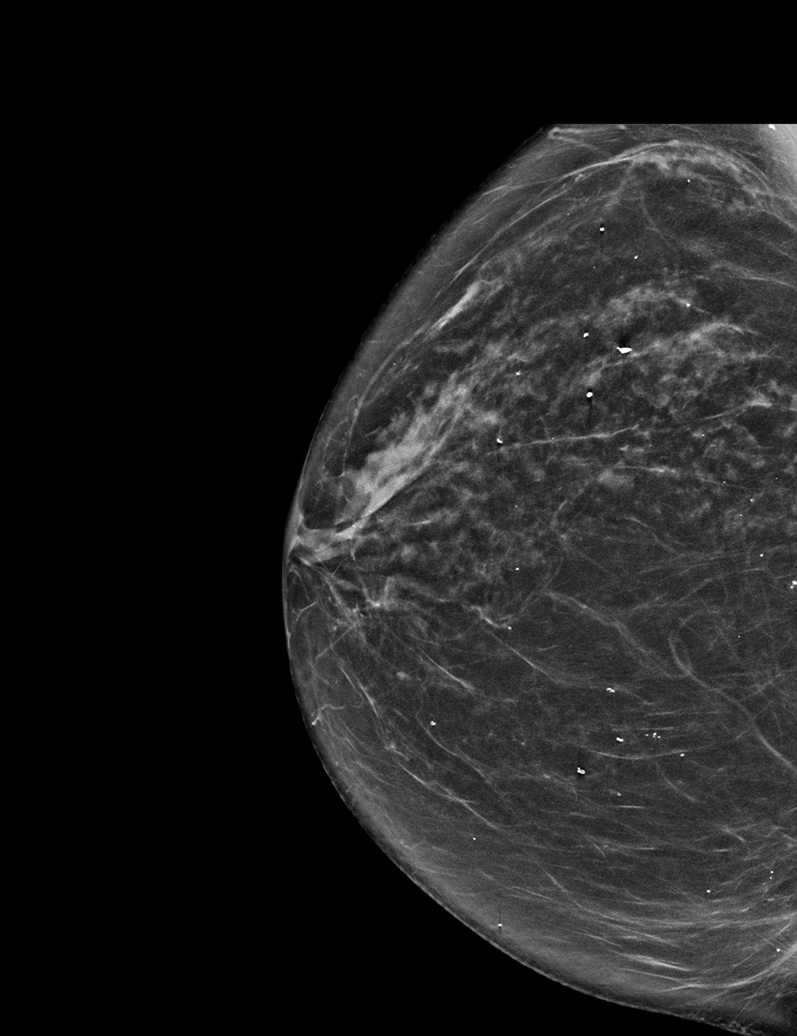

[L MLO synth-2D]
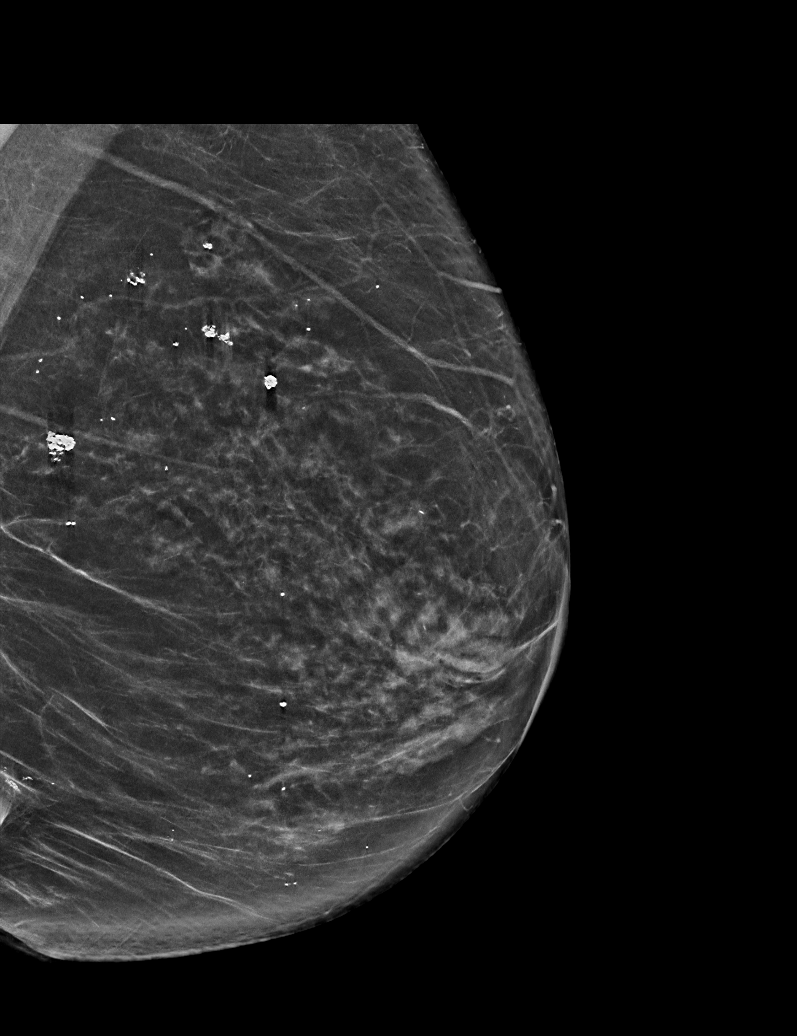

[6 of 36 positions shown; findings below may reference images not displayed]

ACR Breast Density Category b: There are scattered areas of
fibroglandular density.
FINDINGS: There are no findings suspicious for malignancy. Images were
processed with CAD.
IMPRESSION: No mammographic evidence of malignancy. A result letter of this
screening mammogram will be mailed directly to the patient.

RECOMMENDATION:
Screening mammogram in one year. (Code:[id number])

BI-RADS CATEGORY  1: Negative.

## 2021-05-06 ENCOUNTER — Encounter: Payer: Self-pay | Admitting: Cardiology

## 2021-05-06 ENCOUNTER — Encounter: Payer: Self-pay | Admitting: *Deleted

## 2021-05-06 ENCOUNTER — Ambulatory Visit: Payer: Medicare Other | Admitting: Cardiology

## 2021-05-06 VITALS — BP 126/80 | HR 82 | Resp 20 | Ht 63.0 in | Wt 121.0 lb

## 2021-05-06 DIAGNOSIS — R0789 Other chest pain: Secondary | ICD-10-CM | POA: Diagnosis not present

## 2021-05-06 DIAGNOSIS — I10 Essential (primary) hypertension: Secondary | ICD-10-CM

## 2021-05-06 DIAGNOSIS — I340 Nonrheumatic mitral (valve) insufficiency: Secondary | ICD-10-CM | POA: Diagnosis not present

## 2021-05-06 DIAGNOSIS — R0602 Shortness of breath: Secondary | ICD-10-CM | POA: Diagnosis not present

## 2021-05-06 NOTE — Patient Instructions (Addendum)

## 2021-05-06 NOTE — Progress Notes (Signed)
Clinical Summary Desiree Baxter is a 80 y.o.female seen today for follow up of the following medical problem.s      1. Chest pain - history of chest pain in the past, previously worked up by Dr Diona Browner. She had an equivocal nuclear stress test followed by a cath which showed normal coronaries in 11/2010. -  GXT for SOB showed nonspecific ST/T changes, f/u lexiscan without ischemia in 2017     -no recent symptoms.  - chronic SOB with walking up stairs unchanged     2. HTN - compliant with meds - she reports she had been on lisionpril, she stopped due to her SOB.  - may consider ARB in the future  - she is compliant with meds       3. Mitral regurgitation - mild to moderate by echo in 2012 [facility] 06/2015 with LVEF 60-65%, mild MR, mild to mod TR   - denies any symptoms     4. SOB - started 2-3 months ago. DOE with walking up her 13 stairs at home, limited by knee pains and SOB. Can also occur with walking her dog. Can have nonspecific chest feeling in her chest, mainly at night with laying down. Does not occur with exertion. No LE edema, no abdominal distension. No orthopnea - can have some cough. No history of smoking. Reports she used to work in [employer] with some potential chemical exposure.     - echo Jan 2017 LVEF 60-65%, grade I diastolic dysfunction, mild MR, mild to mod TR,   - GXT which did show some ST/T changes, no O2 desaturations, exaggerated heart rate response and hypertenstive response. Exercised 40 sec.   - 5/2017lexiscan negative for ischemia - notes some improvement in breathing. She stopped lisionpril on her own as trial to see if improved.     - She reports PFTs at [facility] showed some COPD. Also had some anemia, received iron treatments.        -chronic stable SOB primarily with walking up flights of stairs     SH: her husband is also patient of mine [name]   Past Medical History:  Diagnosis Date   Diabetes  mellitus    Essential hypertension, benign    Lipoma    right lower leg   Mixed hyperlipidemia    Type 2 diabetes mellitus (HCC)    Neuropathy   Type II or unspecified type diabetes mellitus with neurological manifestations, not stated as uncontrolled(250.60)      Allergies  Allergen Reactions   Elavil [Amitriptyline Hcl]     Too sleepy     Current Outpatient Medications  Medication Sig Dispense Refill   amLODipine (NORVASC) 10 MG tablet Take 1 tablet by mouth daily.     amoxicillin (AMOXIL) 500 MG capsule Take 4 capsules by mouth as directed. Prior to dental work  0   aspirin EC 81 MG tablet Take 81 mg by mouth daily.     atorvastatin (LIPITOR) 10 MG tablet Take 5 mg by mouth daily.     Cinnamon 500 MG capsule Take 1,000 mg by mouth 2 (two) times daily.     Flaxseed, Linseed, (FLAX SEED OIL) 1000 MG CAPS Take 1 capsule by mouth 2 (two) times daily.     glipiZIDE (GLUCOTROL XL) 5 MG 24 hr tablet Take 5 mg by mouth daily.     JARDIANCE 25 MG TABS tablet Take 25 mg by mouth daily.  metFORMIN (GLUCOPHAGE) 500 MG tablet Take 1,000 mg by mouth 2 (two) times daily with a meal.      metoprolol succinate (TOPROL-XL) 25 MG 24 hr tablet Take 25 mg by mouth daily.     Multiple Vitamin (MULTIVITAMIN WITH MINERALS) TABS tablet Take 1 tablet by mouth daily.     No current facility-administered medications for this visit.   Facility-Administered Medications Ordered in Other Visits  Medication Dose Route Frequency Provider Last Rate Last Admin   fentaNYL (SUBLIMAZE) injection 25-50 mcg  25-50 mcg Intravenous Q5 min PRN Laurene Footman, MD         Past Surgical History:  Procedure Laterality Date   CARDIAC CATHETERIZATION     4 months ago   CARPAL TUNNEL RELEASE     right wrist   CATARACT EXTRACTION W/PHACO  05/13/2011   Procedure: CATARACT EXTRACTION PHACO AND INTRAOCULAR LENS PLACEMENT (IOC);  Surgeon: Gemma Payor;  Location: [facility];  Service: Ophthalmology;  Laterality: Right;   CDE=17.48   CATARACT EXTRACTION W/PHACO Left 10/15/2013   Procedure: CATARACT EXTRACTION PHACO AND INTRAOCULAR LENS PLACEMENT (IOC);  Surgeon: Gemma Payor, MD;  Location: [facility];  Service: Ophthalmology;  Laterality: Left;  CDE 10.56   Cataract surgery     TEMPOROMANDIBULAR JOINT SURGERY Bilateral 19 yrs ago   TUBAL LIGATION     mmh     Allergies  Allergen Reactions   Elavil [Amitriptyline Hcl]     Too sleepy      Family History  Problem Relation Age of Onset   Cancer Father    Dementia Mother    Breast cancer Sister    Breast cancer Maternal Grandmother    Breast cancer Sister    Anesthesia problems Neg Hx    Hypotension Neg Hx    Malignant hyperthermia Neg Hx    Pseudochol deficiency Neg Hx      Social History Desiree Baxter reports that she has never smoked. She has never used smokeless tobacco. Desiree Baxter reports no history of alcohol use.   Review of Systems CONSTITUTIONAL: No weight loss, fever, chills, weakness or fatigue.  HEENT: Eyes: No visual loss, blurred vision, double vision or yellow sclerae.No hearing loss, sneezing, congestion, runny nose or sore throat.  SKIN: No rash or itching.  CARDIOVASCULAR: per hpi RESPIRATORY: No shortness of breath, cough or sputum.  GASTROINTESTINAL: No anorexia, nausea, vomiting or diarrhea. No abdominal pain or blood.  GENITOURINARY: No burning on urination, no polyuria NEUROLOGICAL: No headache, dizziness, syncope, paralysis, ataxia, numbness or tingling in the extremities. No change in bowel or bladder control.  MUSCULOSKELETAL: No muscle, back pain, joint pain or stiffness.  LYMPHATICS: No enlarged nodes. No history of splenectomy.  PSYCHIATRIC: No history of depression or anxiety.  ENDOCRINOLOGIC: No reports of sweating, cold or heat intolerance. No polyuria or polydipsia.  Marland Kitchen   Physical Examination Today's Vitals   05/06/21 1129  BP: 126/80  Pulse: 82  Resp: 20  SpO2: 99%  Weight: 121 lb (54.9 kg)  Height: 5\' 3"   (1.6 m)  PainSc: 0-No pain   Body mass index is 21.43 kg/m.  Gen: resting comfortably, no acute distress HEENT: no scleral icterus, pupils equal round and reactive, no palptable cervical adenopathy,  CV: RRR, no m/rgn no jvd Resp: Clear to auscultation bilaterally GI: abdomen is soft, non-tender, non-distended, normal bowel sounds, no hepatosplenomegaly MSK: extremities are warm, no edema.  Skin: warm, no rash Neuro:  no focal deficits Psych: appropriate affect   Diagnostic Studies 11/2010 cath  HEMODYNAMIC DATA:  Aortic pressure is 172/87 with a mean of 124 mmHg, left ventricular pressure is 168 with EDP of 21 mmHg.   ANGIOGRAPHIC DATA:  The left coronary arises and distributes normally. The left main coronary artery is relatively short and is normal. The left anterior descending artery appears normal throughout. The left circumflex coronary artery is normal.  It gives rise to 3 obtuse marginal vessels. The right coronary artery arises and distributes normally.  It is a normal vessel. Left ventricular angiography was performed in the RAO view.  This demonstrates normal left ventricular size and contractility.  Ejection fraction is estimated at 65%.  There was no mitral insufficiency or prolapse.   FINAL INTERPRETATION: 1. Normal coronary anatomy. 2. Normal left ventricular function.   08/2010 echo LVEF 60-65%, diastolic function not desribed, mild to mod MR, mild to mod TR   09/2015 GXT Equivocal GXT results. There was approximately 0.5-1 mm upslopingST segment depression noted in leads II, III, and aVF as well as V4 through V6 6. Baseline ST segment abnormalities also present. No chest pain reported. There was exaggerated increase in heart rate as well as hypertensive response. No oxygen desaturation noted with activity (96% in early recovery). Duke treadmill score technically intermediate risk due to limited exercise time and ST segment changes. Would correlate  clinically. There was exaggerated increase in heart rate as well as hypertensive response. No oxygen desaturation noted with activity (96% in early recovery). Duke treadmill score technically intermediate risk due to limited exercise time and ST segment changes. Would correlate clinically.   10/2015 Lexiscan MPI No T wave inversion was noted during stress. There was no ST segment deviation noted during stress. The study is normal. There are no defects consistent with prior infarct or current ischemia This is a low risk study. The left ventricular ejection fraction is hyperdynamic (>65%).   Jan 2017 echo LVEF 60-65%, grade I diastolic dysfunction, normal RV, mild MR, mild to mod TR,       Assessment and Plan   1. Chest pain - no recent symptosm, continue to monitor   2. HTN -at goal, continue current meds   3.SOB - negative cardiac workup in the past,appers may have been related to anemia and abnormal PFTs - chronic stable mild symptoms, continue to monitor at this time  4. Mitral regurgitation - mild by 2017 echo, due for repeat surveillance study, will order echo     Antoine Poche, M.D.

## 2021-06-15 ENCOUNTER — Ambulatory Visit (INDEPENDENT_AMBULATORY_CARE_PROVIDER_SITE_OTHER): Payer: Medicare Other

## 2021-06-15 DIAGNOSIS — I340 Nonrheumatic mitral (valve) insufficiency: Secondary | ICD-10-CM

## 2021-06-15 LAB — ECHOCARDIOGRAM COMPLETE
AR max vel: 1.72 cm2
AV Area VTI: 1.69 cm2
AV Area mean vel: 1.66 cm2
AV Mean grad: 2.9 mmHg
AV Peak grad: 5.5 mmHg
Ao pk vel: 1.17 m/s
Area-P 1/2: 4.33 cm2
MV M vel: 4.57 m/s
MV Peak grad: 83.4 mmHg
Radius: 0.4 cm
S' Lateral: 2.57 cm

## 2021-07-05 DIAGNOSIS — E1165 Type 2 diabetes mellitus with hyperglycemia: Secondary | ICD-10-CM | POA: Diagnosis not present

## 2021-07-07 DIAGNOSIS — I10 Essential (primary) hypertension: Secondary | ICD-10-CM | POA: Diagnosis not present

## 2021-07-07 DIAGNOSIS — Z299 Encounter for prophylactic measures, unspecified: Secondary | ICD-10-CM | POA: Diagnosis not present

## 2021-07-07 DIAGNOSIS — E559 Vitamin D deficiency, unspecified: Secondary | ICD-10-CM | POA: Diagnosis not present

## 2021-07-07 DIAGNOSIS — R5383 Other fatigue: Secondary | ICD-10-CM | POA: Diagnosis not present

## 2021-07-07 DIAGNOSIS — Z79899 Other long term (current) drug therapy: Secondary | ICD-10-CM | POA: Diagnosis not present

## 2021-07-07 DIAGNOSIS — E1165 Type 2 diabetes mellitus with hyperglycemia: Secondary | ICD-10-CM | POA: Diagnosis not present

## 2021-07-07 DIAGNOSIS — D649 Anemia, unspecified: Secondary | ICD-10-CM | POA: Diagnosis not present

## 2021-07-07 DIAGNOSIS — E785 Hyperlipidemia, unspecified: Secondary | ICD-10-CM | POA: Diagnosis not present

## 2021-07-07 DIAGNOSIS — Z7189 Other specified counseling: Secondary | ICD-10-CM | POA: Diagnosis not present

## 2021-07-07 DIAGNOSIS — Z6820 Body mass index (BMI) 20.0-20.9, adult: Secondary | ICD-10-CM | POA: Diagnosis not present

## 2021-07-07 DIAGNOSIS — Z0000 Encounter for general adult medical examination without abnormal findings: Secondary | ICD-10-CM | POA: Diagnosis not present

## 2021-07-07 DIAGNOSIS — J449 Chronic obstructive pulmonary disease, unspecified: Secondary | ICD-10-CM | POA: Diagnosis not present

## 2021-07-21 ENCOUNTER — Telehealth: Payer: Self-pay | Admitting: *Deleted

## 2021-07-21 NOTE — Telephone Encounter (Signed)
Lesle Chris, LPN  7/86/7672  3:02 PM EST Back to Top    Notified, copy to pcp.    Lesle Chris, LPN  0/94/7096  4:50 PM EST     No answer.    Antoine Poche, MD  07/12/2021  4:51 PM EST     Echo looks good, heart pumping function is normal. Mitral valve has just a mild leak, nothing of concerng at this time   Dominga Ferry MD

## 2021-08-04 DIAGNOSIS — E1165 Type 2 diabetes mellitus with hyperglycemia: Secondary | ICD-10-CM | POA: Diagnosis not present

## 2021-08-18 DIAGNOSIS — I10 Essential (primary) hypertension: Secondary | ICD-10-CM | POA: Diagnosis not present

## 2021-08-18 DIAGNOSIS — R350 Frequency of micturition: Secondary | ICD-10-CM | POA: Diagnosis not present

## 2021-08-18 DIAGNOSIS — Z299 Encounter for prophylactic measures, unspecified: Secondary | ICD-10-CM | POA: Diagnosis not present

## 2021-08-18 DIAGNOSIS — N390 Urinary tract infection, site not specified: Secondary | ICD-10-CM | POA: Diagnosis not present

## 2021-08-18 DIAGNOSIS — Z6820 Body mass index (BMI) 20.0-20.9, adult: Secondary | ICD-10-CM | POA: Diagnosis not present

## 2021-08-18 DIAGNOSIS — I509 Heart failure, unspecified: Secondary | ICD-10-CM | POA: Diagnosis not present

## 2021-09-03 DIAGNOSIS — I10 Essential (primary) hypertension: Secondary | ICD-10-CM | POA: Diagnosis not present

## 2021-09-03 DIAGNOSIS — E785 Hyperlipidemia, unspecified: Secondary | ICD-10-CM | POA: Diagnosis not present

## 2021-09-03 DIAGNOSIS — E1165 Type 2 diabetes mellitus with hyperglycemia: Secondary | ICD-10-CM | POA: Diagnosis not present

## 2021-10-04 DIAGNOSIS — E1165 Type 2 diabetes mellitus with hyperglycemia: Secondary | ICD-10-CM | POA: Diagnosis not present

## 2021-10-06 DIAGNOSIS — Z299 Encounter for prophylactic measures, unspecified: Secondary | ICD-10-CM | POA: Diagnosis not present

## 2021-10-06 DIAGNOSIS — I10 Essential (primary) hypertension: Secondary | ICD-10-CM | POA: Diagnosis not present

## 2021-10-06 DIAGNOSIS — Z6820 Body mass index (BMI) 20.0-20.9, adult: Secondary | ICD-10-CM | POA: Diagnosis not present

## 2021-10-06 DIAGNOSIS — Z0000 Encounter for general adult medical examination without abnormal findings: Secondary | ICD-10-CM | POA: Diagnosis not present

## 2021-10-06 DIAGNOSIS — J449 Chronic obstructive pulmonary disease, unspecified: Secondary | ICD-10-CM | POA: Diagnosis not present

## 2021-10-06 DIAGNOSIS — I509 Heart failure, unspecified: Secondary | ICD-10-CM | POA: Diagnosis not present

## 2021-11-03 DIAGNOSIS — E1165 Type 2 diabetes mellitus with hyperglycemia: Secondary | ICD-10-CM | POA: Diagnosis not present

## 2021-11-06 ENCOUNTER — Encounter: Payer: Self-pay | Admitting: Cardiology

## 2021-11-06 ENCOUNTER — Ambulatory Visit: Payer: Medicare Other | Admitting: Cardiology

## 2021-11-06 VITALS — BP 120/68 | HR 90 | Ht 63.0 in | Wt 119.4 lb

## 2021-11-06 DIAGNOSIS — E782 Mixed hyperlipidemia: Secondary | ICD-10-CM

## 2021-11-06 DIAGNOSIS — I10 Essential (primary) hypertension: Secondary | ICD-10-CM

## 2021-11-06 DIAGNOSIS — R0602 Shortness of breath: Secondary | ICD-10-CM | POA: Diagnosis not present

## 2021-11-06 MED ORDER — ATORVASTATIN CALCIUM 10 MG PO TABS: 10.0000 mg | ORAL_TABLET | Freq: Every day | ORAL | 6 refills | Status: DC

## 2021-11-06 NOTE — Patient Instructions (Signed)
Medication Instructions:  Begin Atorvastatin 10mg  daily  Continue all other medications.     Labwork: none  Testing/Procedures: none  Follow-Up: 6 months   Any Other Special Instructions Will Be Listed Below (If Applicable).   If you need a refill on your cardiac medications before your next appointment, please call your pharmacy.

## 2021-11-06 NOTE — Progress Notes (Signed)
Clinical Summary Desiree Baxter is a 81 y.o.female seen today for follow up of the following medical problem.s      1. Chest pain - history of chest pain in the past, previously worked up by Dr Diona BrownerMcDowell. She had an equivocal nuclear stress test followed by a cath which showed normal coronaries in 11/2010. -  GXT for SOB showed nonspecific ST/T changes, f/u lexiscan without ischemia in 2017  - no recent issues     2. HTN - compliant with meds - she reports she had been on lisionpril, she stopped due to her SOB.  - may consider ARB in the future   - she is compliant with meds       3. Mitral regurgitation - mild to moderate by echo in 2012 [facility] 06/2015 with LVEF 60-65%, mild MR, mild to mod TR   -Jan 2023 echo LVEF 60-65%, grade I dd, mild MR   4. SOB - started 2-3 months ago. DOE with walking up her 13 stairs at home, limited by knee pains and SOB. Can also occur with walking her dog. Can have nonspecific chest feeling in her chest, mainly at night with laying down. Does not occur with exertion. No LE edema, no abdominal distension. No orthopnea - can have some cough. No history of smoking. Reports she used to work in [employer] with some potential chemical exposure.     - echo Jan 2017 LVEF 60-65%, grade I diastolic dysfunction, mild MR, mild to mod TR,   - GXT which did show some ST/T changes, no O2 desaturations, exaggerated heart rate response and hypertenstive response. Exercised 1min 40 sec.   - 5/2017lexiscan negative for ischemia - notes some improvement in breathing. She stopped lisionpril on her own as trial to see if improved.     - [facility]. Also had some anemia, received iron treatments.   - reports breathing has improved, chrnoic stable symptoms with walking up stairs.   5. Hyperlipidemia - not sure if she is taking atorvastatin.      SH: her husband is also patient of mine [employer]     Past  Medical History:  Diagnosis Date   Diabetes mellitus    Essential hypertension, benign    Lipoma    right lower leg   Mixed hyperlipidemia    Type 2 diabetes mellitus (HCC)    Neuropathy   Type II or unspecified type diabetes mellitus with neurological manifestations, not stated as uncontrolled(250.60)      Allergies  Allergen Reactions   Elavil [Amitriptyline Hcl]     Too sleepy     Current Outpatient Medications  Medication Sig Dispense Refill   amLODipine (NORVASC) 10 MG tablet Take 1 tablet by mouth daily.     amoxicillin (AMOXIL) 500 MG capsule Take 4 capsules by mouth as directed. Prior to dental work  0   aspirin EC 81 MG tablet Take 81 mg by mouth daily.     atorvastatin (LIPITOR) 10 MG tablet Take 5 mg by mouth daily.     Cinnamon 500 MG capsule Take 1,000 mg by mouth 2 (two) times daily.     Flaxseed, Linseed, (FLAX SEED OIL) 1000 MG CAPS Take 1 capsule by mouth 2 (two) times daily.     glipiZIDE (GLUCOTROL XL) 5 MG 24 hr tablet Take 5 mg by mouth daily.     JARDIANCE 25 MG TABS tablet Take 25 mg by  mouth daily.     metFORMIN (GLUCOPHAGE) 500 MG tablet Take 1,000 mg by mouth 2 (two) times daily with a meal.      metoprolol succinate (TOPROL-XL) 25 MG 24 hr tablet Take 25 mg by mouth daily.     Multiple Vitamin (MULTIVITAMIN WITH MINERALS) TABS tablet Take 1 tablet by mouth daily.     No current facility-administered medications for this visit.   Facility-Administered Medications Ordered in Other Visits  Medication Dose Route Frequency Provider Last Rate Last Admin   fentaNYL (SUBLIMAZE) injection 25-50 mcg  25-50 mcg Intravenous Q5 min PRN Laurene Footman, MD         Past Surgical History:  Procedure Laterality Date   CARDIAC CATHETERIZATION     4 months ago   CARPAL TUNNEL RELEASE     right wrist   CATARACT EXTRACTION W/PHACO  05/13/2011   Procedure: CATARACT EXTRACTION PHACO AND INTRAOCULAR LENS PLACEMENT (IOC);  Surgeon: Gemma Payor;  Location: [facility];   Service: Ophthalmology;  Laterality: Right;  CDE=17.48   CATARACT EXTRACTION W/PHACO Left 10/15/2013   Procedure: CATARACT EXTRACTION PHACO AND INTRAOCULAR LENS PLACEMENT (IOC);  Surgeon: Gemma Payor, MD;  Location: [facility];  Service: Ophthalmology;  Laterality: Left;  CDE 10.56   Cataract surgery     TEMPOROMANDIBULAR JOINT SURGERY Bilateral 19 yrs ago   TUBAL LIGATION     mmh     Allergies  Allergen Reactions   Elavil [Amitriptyline Hcl]     Too sleepy      Family History  Problem Relation Age of Onset   Cancer Father    Dementia Mother    Breast cancer Sister    Breast cancer Maternal Grandmother    Breast cancer Sister    Anesthesia problems Neg Hx    Hypotension Neg Hx    Malignant hyperthermia Neg Hx    Pseudochol deficiency Neg Hx      Social History Ms. Lungren reports that she has never smoked. She has never used smokeless tobacco. Ms. Chaudoin reports no history of alcohol use.   Review of Systems CONSTITUTIONAL: No weight loss, fever, chills, weakness or fatigue.  HEENT: Eyes: No visual loss, blurred vision, double vision or yellow sclerae.No hearing loss, sneezing, congestion, runny nose or sore throat.  SKIN: No rash or itching.  CARDIOVASCULAR: per hpi RESPIRATORY: No shortness of breath, cough or sputum.  GASTROINTESTINAL: No anorexia, nausea, vomiting or diarrhea. No abdominal pain or blood.  GENITOURINARY: No burning on urination, no polyuria NEUROLOGICAL: No headache, dizziness, syncope, paralysis, ataxia, numbness or tingling in the extremities. No change in bowel or bladder control.  MUSCULOSKELETAL: No muscle, back pain, joint pain or stiffness.  LYMPHATICS: No enlarged nodes. No history of splenectomy.  PSYCHIATRIC: No history of depression or anxiety.  ENDOCRINOLOGIC: No reports of sweating, cold or heat intolerance. No polyuria or polydipsia.  Marland Kitchen   Physical Examination Today's Vitals   11/06/21 1354  BP: 120/68  Pulse: 90  SpO2: 97%  Weight:  119 lb 6.4 oz (54.2 kg)  Height: 5\' 3"  (1.6 m)   Body mass index is 21.15 kg/m.  Gen: resting comfortably, no acute distress HEENT: no scleral icterus, pupils equal round and reactive, no palptable cervical adenopathy,  CV: RRR, no mr/g no jvd Resp: Clear to auscultation bilaterally GI: abdomen is soft, non-tender, non-distended, normal bowel sounds, no hepatosplenomegaly MSK: extremities are warm, no edema.  Skin: warm, no rash Neuro:  no focal deficits Psych: appropriate affect   Diagnostic Studies  11/2010 cath HEMODYNAMIC DATA:  Aortic pressure is 172/87 with a mean of 124 mmHg, left ventricular pressure is 168 with EDP of 21 mmHg.   ANGIOGRAPHIC DATA:  The left coronary arises and distributes normally. The left main coronary artery is relatively short and is normal. The left anterior descending artery appears normal throughout. The left circumflex coronary artery is normal.  It gives rise to 3 obtuse marginal vessels. The right coronary artery arises and distributes normally.  It is a normal vessel. Left ventricular angiography was performed in the RAO view.  This demonstrates normal left ventricular size and contractility.  Ejection fraction is estimated at 65%.  There was no mitral insufficiency or prolapse.   FINAL INTERPRETATION: 1. Normal coronary anatomy. 2. Normal left ventricular function.   08/2010 echo LVEF 60-65%, diastolic function not desribed, mild to mod MR, mild to mod TR   09/2015 GXT Equivocal GXT results. There was approximately 0.5-1 mm upslopingST segment depression noted in leads II, III, and aVF as well as V4 through V6 6. Baseline ST segment abnormalities also present. No chest pain reported. There was exaggerated increase in heart rate as well as hypertensive response. No oxygen desaturation noted with activity (96% in early recovery). Duke treadmill score technically intermediate risk due to limited exercise time and ST segment changes. Would  correlate clinically. There was exaggerated increase in heart rate as well as hypertensive response. No oxygen desaturation noted with activity (96% in early recovery). Duke treadmill score technically intermediate risk due to limited exercise time and ST segment changes. Would correlate clinically.   10/2015 Lexiscan MPI No T wave inversion was noted during stress. There was no ST segment deviation noted during stress. The study is normal. There are no defects consistent with prior infarct or current ischemia This is a low risk study. The left ventricular ejection fraction is hyperdynamic (>65%).   Jan 2017 echo LVEF 60-65%, grade I diastolic dysfunction, normal RV, mild MR, mild to mod TR,      Jan 2023 echo IMPRESSIONS     1. Left ventricular ejection fraction, by estimation, is 60 to 65%. The  left ventricle has normal function. The left ventricle has no regional  wall motion abnormalities. Left ventricular diastolic parameters are  consistent with Grade I diastolic  dysfunction (impaired relaxation).   2. Right ventricular systolic function is normal. The right ventricular  size is normal. There is normal pulmonary artery systolic pressure.   3. The mitral valve is normal in structure. Mild mitral valve  regurgitation. No evidence of mitral stenosis.   4. The aortic valve is tricuspid. There is mild calcification of the  aortic valve. There is mild thickening of the aortic valve. Aortic valve  regurgitation is not visualized. No aortic stenosis is present.   5. The inferior vena cava is normal in size with greater than 50%  respiratory variability, suggesting right atrial pressure of 3 mmHg.   Assessment and Plan   1. HTN -bp at goal, continue current meds   2.SOB - negative cardiac workup in the past,appers may have been related to anemia and abnormal PFTs Symptoms improving over time, continue to monitor.    3. Hyperlipidemia - restart atorvastatin at 10mg  daily    EKG today shows NSR  F/u 6 months  , M.D.

## 2021-11-20 DIAGNOSIS — I509 Heart failure, unspecified: Secondary | ICD-10-CM | POA: Diagnosis not present

## 2021-11-20 DIAGNOSIS — E1165 Type 2 diabetes mellitus with hyperglycemia: Secondary | ICD-10-CM | POA: Diagnosis not present

## 2021-11-20 DIAGNOSIS — Z299 Encounter for prophylactic measures, unspecified: Secondary | ICD-10-CM | POA: Diagnosis not present

## 2021-11-20 DIAGNOSIS — I10 Essential (primary) hypertension: Secondary | ICD-10-CM | POA: Diagnosis not present

## 2021-11-20 DIAGNOSIS — Z6820 Body mass index (BMI) 20.0-20.9, adult: Secondary | ICD-10-CM | POA: Diagnosis not present

## 2021-12-03 DIAGNOSIS — E1165 Type 2 diabetes mellitus with hyperglycemia: Secondary | ICD-10-CM | POA: Diagnosis not present

## 2021-12-21 DIAGNOSIS — Z7984 Long term (current) use of oral hypoglycemic drugs: Secondary | ICD-10-CM | POA: Diagnosis not present

## 2021-12-21 DIAGNOSIS — Z961 Presence of intraocular lens: Secondary | ICD-10-CM | POA: Diagnosis not present

## 2021-12-21 DIAGNOSIS — H16223 Keratoconjunctivitis sicca, not specified as Sjogren's, bilateral: Secondary | ICD-10-CM | POA: Diagnosis not present

## 2021-12-21 DIAGNOSIS — E119 Type 2 diabetes mellitus without complications: Secondary | ICD-10-CM | POA: Diagnosis not present

## 2022-01-04 DIAGNOSIS — E1165 Type 2 diabetes mellitus with hyperglycemia: Secondary | ICD-10-CM | POA: Diagnosis not present

## 2022-01-05 DIAGNOSIS — Z299 Encounter for prophylactic measures, unspecified: Secondary | ICD-10-CM | POA: Diagnosis not present

## 2022-01-05 DIAGNOSIS — E1165 Type 2 diabetes mellitus with hyperglycemia: Secondary | ICD-10-CM | POA: Diagnosis not present

## 2022-01-05 DIAGNOSIS — I10 Essential (primary) hypertension: Secondary | ICD-10-CM | POA: Diagnosis not present

## 2022-02-03 DIAGNOSIS — E1165 Type 2 diabetes mellitus with hyperglycemia: Secondary | ICD-10-CM | POA: Diagnosis not present

## 2022-02-19 ENCOUNTER — Other Ambulatory Visit: Payer: Self-pay | Admitting: Internal Medicine

## 2022-02-19 DIAGNOSIS — Z139 Encounter for screening, unspecified: Secondary | ICD-10-CM

## 2022-02-26 DIAGNOSIS — E1165 Type 2 diabetes mellitus with hyperglycemia: Secondary | ICD-10-CM | POA: Diagnosis not present

## 2022-02-26 DIAGNOSIS — Z299 Encounter for prophylactic measures, unspecified: Secondary | ICD-10-CM | POA: Diagnosis not present

## 2022-02-26 DIAGNOSIS — I10 Essential (primary) hypertension: Secondary | ICD-10-CM | POA: Diagnosis not present

## 2022-02-26 DIAGNOSIS — Z713 Dietary counseling and surveillance: Secondary | ICD-10-CM | POA: Diagnosis not present

## 2022-02-26 DIAGNOSIS — Z6820 Body mass index (BMI) 20.0-20.9, adult: Secondary | ICD-10-CM | POA: Diagnosis not present

## 2022-02-26 DIAGNOSIS — Z23 Encounter for immunization: Secondary | ICD-10-CM | POA: Diagnosis not present

## 2022-03-05 DIAGNOSIS — E1165 Type 2 diabetes mellitus with hyperglycemia: Secondary | ICD-10-CM | POA: Diagnosis not present

## 2022-03-22 ENCOUNTER — Ambulatory Visit: Payer: Medicare Other

## 2022-04-05 DIAGNOSIS — E1165 Type 2 diabetes mellitus with hyperglycemia: Secondary | ICD-10-CM | POA: Diagnosis not present

## 2022-04-12 DIAGNOSIS — R070 Pain in throat: Secondary | ICD-10-CM | POA: Diagnosis not present

## 2022-04-12 DIAGNOSIS — J069 Acute upper respiratory infection, unspecified: Secondary | ICD-10-CM | POA: Diagnosis not present

## 2022-04-12 DIAGNOSIS — Z299 Encounter for prophylactic measures, unspecified: Secondary | ICD-10-CM | POA: Diagnosis not present

## 2022-04-20 ENCOUNTER — Ambulatory Visit
Admission: RE | Admit: 2022-04-20 | Discharge: 2022-04-20 | Disposition: A | Discharge status: Home / Self Care | Payer: Medicare Other | Source: Ambulatory Visit | Attending: Internal Medicine | Admitting: Internal Medicine

## 2022-04-20 DIAGNOSIS — Z1231 Encounter for screening mammogram for malignant neoplasm of breast: Secondary | ICD-10-CM | POA: Diagnosis not present

## 2022-04-20 DIAGNOSIS — Z139 Encounter for screening, unspecified: Secondary | ICD-10-CM

## 2022-05-05 DIAGNOSIS — E1165 Type 2 diabetes mellitus with hyperglycemia: Secondary | ICD-10-CM | POA: Diagnosis not present

## 2022-05-09 ENCOUNTER — Other Ambulatory Visit: Payer: Self-pay | Admitting: Cardiology

## 2022-05-14 DIAGNOSIS — Z299 Encounter for prophylactic measures, unspecified: Secondary | ICD-10-CM | POA: Diagnosis not present

## 2022-05-14 DIAGNOSIS — I509 Heart failure, unspecified: Secondary | ICD-10-CM | POA: Diagnosis not present

## 2022-05-14 DIAGNOSIS — E785 Hyperlipidemia, unspecified: Secondary | ICD-10-CM | POA: Diagnosis not present

## 2022-05-14 DIAGNOSIS — I10 Essential (primary) hypertension: Secondary | ICD-10-CM | POA: Diagnosis not present

## 2022-05-14 DIAGNOSIS — Z681 Body mass index (BMI) 19 or less, adult: Secondary | ICD-10-CM | POA: Diagnosis not present

## 2022-05-24 ENCOUNTER — Encounter: Payer: Self-pay | Admitting: *Deleted

## 2022-05-24 ENCOUNTER — Ambulatory Visit: Payer: Medicare Other | Attending: Cardiology | Admitting: Cardiology

## 2022-05-24 ENCOUNTER — Encounter: Payer: Self-pay | Admitting: Cardiology

## 2022-05-24 VITALS — BP 118/70 | HR 91 | Ht 63.5 in | Wt 112.6 lb

## 2022-05-24 DIAGNOSIS — R0602 Shortness of breath: Secondary | ICD-10-CM | POA: Diagnosis not present

## 2022-05-24 DIAGNOSIS — I10 Essential (primary) hypertension: Secondary | ICD-10-CM | POA: Diagnosis not present

## 2022-05-24 DIAGNOSIS — R42 Dizziness and giddiness: Secondary | ICD-10-CM

## 2022-05-24 DIAGNOSIS — E782 Mixed hyperlipidemia: Secondary | ICD-10-CM | POA: Diagnosis not present

## 2022-05-24 NOTE — Progress Notes (Signed)
Clinical Summary Ms. Klumb is a 81 y.o.female seen today for follow up of the following medical problem.s      1. Chest pain - history of chest pain in the past, previously worked up by Dr Diona Browner. She had an equivocal nuclear stress test followed by a cath which showed normal coronaries in 11/2010. -  GXT for SOB showed nonspecific ST/T changes, f/u lexiscan without ischemia in 2017   - no chest pains.     2. Dizziness - can occur with bending over - episode yesteday, singing in choir. +dizzy, felt hot, no nausea, no palpitations.  - sat down and felt better - not much water day. Mountin dew sundrop caffeine x 2 cups.        3. HTN - compliant with meds - she reports she had been on lisionpril, she stopped due to her SOB.  - may consider ARB in the future   - compliant with meds       4. Mitral regurgitation - mild to moderate by echo in 2012 [facility] 06/2015 with LVEF 60-65%, mild MR, mild to mod TR   -Jan 2023 echo LVEF 60-65%, grade I dd, mild MR   5. SOB - started 2-3 months ago. DOE with walking up her 13 stairs at home, limited by knee pains and SOB. Can also occur with walking her dog. Can have nonspecific chest feeling in her chest, mainly at night with laying down. Does not occur with exertion. No LE edema, no abdominal distension. No orthopnea - can have some cough. No history of smoking. Reports she used to work in [employer] with some potential chemical exposure.     - echo Jan 2017 LVEF 60-65%, grade I diastolic dysfunction, mild MR, mild to mod TR,   - GXT which did show some ST/T changes, no O2 desaturations, exaggerated heart rate response and hypertenstive response. Exercised 40 sec.   - 5/2017lexiscan negative for ischemia - notes some improvement in breathing. She stopped lisionpril on her own as trial to see if improved.     - She reports PFTs at [facility] showed some COPD. Also had some anemia, received iron treatments.     - reports breathing has improved. Overall doing well   5. Hyperlipidemia - not sure if she is taking atorvastatin.   08/2021 TC 170 TG 88 HDL 71 LDL 83   SH: her husband is also patient of mine [name]     Past Medical History:  Diagnosis Date   Diabetes mellitus    Essential hypertension, benign    Lipoma    right lower leg   Mixed hyperlipidemia    Type 2 diabetes mellitus (HCC)    Neuropathy   Type II or unspecified type diabetes mellitus with neurological manifestations, not stated as uncontrolled(250.60)      Allergies  Allergen Reactions   Elavil [Amitriptyline Hcl]     Too sleepy     Current Outpatient Medications  Medication Sig Dispense Refill   amLODipine (NORVASC) 10 MG tablet Take 1 tablet by mouth daily.     amoxicillin (AMOXIL) 500 MG capsule Take 4 capsules by mouth as directed. Prior to dental work  0   aspirin EC 81 MG tablet Take 81 mg by mouth daily.     atorvastatin (LIPITOR) 10 MG tablet TAKE 1 TABLET BY MOUTH DAILY 30 tablet 6   Cinnamon 500 MG capsule Take 1,000 mg by mouth 2 (two) times  daily.     Flaxseed, Linseed, (FLAX SEED OIL) 1000 MG CAPS Take 1 capsule by mouth 2 (two) times daily.     glipiZIDE (GLUCOTROL XL) 5 MG 24 hr tablet Take 5 mg by mouth daily.     JARDIANCE 25 MG TABS tablet Take 25 mg by mouth daily.     metFORMIN (GLUCOPHAGE) 500 MG tablet Take 1,000 mg by mouth 2 (two) times daily with a meal.      metoprolol succinate (TOPROL-XL) 25 MG 24 hr tablet Take 25 mg by mouth daily.     Multiple Vitamin (MULTIVITAMIN WITH MINERALS) TABS tablet Take 1 tablet by mouth daily.     No current facility-administered medications for this visit.   Facility-Administered Medications Ordered in Other Visits  Medication Dose Route Frequency Provider Last Rate Last Admin   fentaNYL (SUBLIMAZE) injection 25-50 mcg  25-50 mcg Intravenous Q5 min PRN Laurene FootmanGonzalez, Luis, MD         Past Surgical History:  Procedure Laterality Date   CARDIAC  CATHETERIZATION     4 months ago   CARPAL TUNNEL RELEASE     right wrist   CATARACT EXTRACTION W/PHACO  05/13/2011   Procedure: CATARACT EXTRACTION PHACO AND INTRAOCULAR LENS PLACEMENT (IOC);  Surgeon: Gemma PayorKerry Hunt;  Location: [facility];  Service: Ophthalmology;  Laterality: Right;  CDE=17.48   CATARACT EXTRACTION W/PHACO Left 10/15/2013   Procedure: CATARACT EXTRACTION PHACO AND INTRAOCULAR LENS PLACEMENT (IOC);  Surgeon: Gemma PayorKerry Hunt, MD;  Location: [facility];  Service: Ophthalmology;  Laterality: Left;  CDE 10.56   Cataract surgery     TEMPOROMANDIBULAR JOINT SURGERY Bilateral 19 yrs ago   TUBAL LIGATION     mmh     Allergies  Allergen Reactions   Elavil [Amitriptyline Hcl]     Too sleepy      Family History  Problem Relation Age of Onset   Cancer Father    Dementia Mother    Breast cancer Sister    Breast cancer Maternal Grandmother    Breast cancer Sister    Anesthesia problems Neg Hx    Hypotension Neg Hx    Malignant hyperthermia Neg Hx    Pseudochol deficiency Neg Hx      Social History [name] reports that she has never smoked. She has never used smokeless tobacco. [name] reports no history of alcohol use.   Review of Systems CONSTITUTIONAL: No weight loss, fever, chills, weakness or fatigue.  HEENT: Eyes: No visual loss, blurred vision, double vision or yellow sclerae.No hearing loss, sneezing, congestion, runny nose or sore throat.  SKIN: No rash or itching.  CARDIOVASCULAR: per hpi RESPIRATORY: No shortness of breath, cough or sputum.  GASTROINTESTINAL: No anorexia, nausea, vomiting or diarrhea. No abdominal pain or blood.  GENITOURINARY: No burning on urination, no polyuria NEUROLOGICAL: No headache, dizziness, syncope, paralysis, ataxia, numbness or tingling in the extremities. No change in bowel or bladder control.  MUSCULOSKELETAL: No muscle, back pain, joint pain or stiffness.  LYMPHATICS: No enlarged nodes. No history of splenectomy.  PSYCHIATRIC: No  history of depression or anxiety.  ENDOCRINOLOGIC: No reports of sweating, cold or heat intolerance. No polyuria or polydipsia.  Marland Kitchen.   Physical Examination Today's Vitals   05/24/22 1120 05/24/22 1223  BP: (!) 142/80 118/70  Pulse: 91   SpO2: 98%   Weight: 112 lb 9.6 oz (51.1 kg)   Height: 5' 3.5" (1.613 m)    Body mass index is 19.63 kg/m.  Gen: resting comfortably, no acute  distress HEENT: no scleral icterus, pupils equal round and reactive, no palptable cervical adenopathy,  CV: RRR, no m/r/g no jvd Resp: Clear to auscultation bilaterally GI: abdomen is soft, non-tender, non-distended, normal bowel sounds, no hepatosplenomegaly MSK: extremities are warm, no edema.  Skin: warm, no rash Neuro:  no focal deficits Psych: appropriate affect   Diagnostic Studies  11/2010 cath HEMODYNAMIC DATA:  Aortic pressure is 172/87 with a mean of 124 mmHg, left ventricular pressure is 168 with EDP of 21 mmHg.   ANGIOGRAPHIC DATA:  The left coronary arises and distributes normally. The left main coronary artery is relatively short and is normal. The left anterior descending artery appears normal throughout. The left circumflex coronary artery is normal.  It gives rise to 3 obtuse marginal vessels. The right coronary artery arises and distributes normally.  It is a normal vessel. Left ventricular angiography was performed in the RAO view.  This demonstrates normal left ventricular size and contractility.  Ejection fraction is estimated at 65%.  There was no mitral insufficiency or prolapse.   FINAL INTERPRETATION: 1. Normal coronary anatomy. 2. Normal left ventricular function.   08/2010 echo LVEF 60-65%, diastolic function not desribed, mild to mod MR, mild to mod TR   09/2015 GXT Equivocal GXT results. There was approximately 0.5-1 mm upslopingST segment depression noted in leads II, III, and aVF as well as V4 through V6 6. Baseline ST segment abnormalities also present. No chest  pain reported. There was exaggerated increase in heart rate as well as hypertensive response. No oxygen desaturation noted with activity (96% in early recovery). Duke treadmill score technically intermediate risk due to limited exercise time and ST segment changes. Would correlate clinically. There was exaggerated increase in heart rate as well as hypertensive response. No oxygen desaturation noted with activity (96% in early recovery). Duke treadmill score technically intermediate risk due to limited exercise time and ST segment changes. Would correlate clinically.   10/2015 Lexiscan MPI No T wave inversion was noted during stress. There was no ST segment deviation noted during stress. The study is normal. There are no defects consistent with prior infarct or current ischemia This is a low risk study. The left ventricular ejection fraction is hyperdynamic (>65%).   Jan 2017 echo LVEF 60-65%, grade I diastolic dysfunction, normal RV, mild MR, mild to mod TR,      Jan 2023 echo IMPRESSIONS     1. Left ventricular ejection fraction, by estimation, is 60 to 65%. The  left ventricle has normal function. The left ventricle has no regional  wall motion abnormalities. Left ventricular diastolic parameters are  consistent with Grade I diastolic  dysfunction (impaired relaxation).   2. Right ventricular systolic function is normal. The right ventricular  size is normal. There is normal pulmonary artery systolic pressure.   3. The mitral valve is normal in structure. Mild mitral valve  regurgitation. No evidence of mitral stenosis.   4. The aortic valve is tricuspid. There is mild calcification of the  aortic valve. There is mild thickening of the aortic valve. Aortic valve  regurgitation is not visualized. No aortic stenosis is present.   5. The inferior vena cava is normal in size with greater than 50%  respiratory variability, suggesting right atrial pressure of 3 mmHg.      Assessment  and Plan   1. HTN -at goal based on manual recheck, continue current meds   2.SOB - negative cardiac workup in the past,appers may have been related to anemia  and abnormal PFTs No recent issues, continue to monitor  3. Dizziness - likely orthostatic, poor oral hydration at home. Encouraged increased oral hydration at least 4-6 bottels of water per day.    4. Hyperlipidemia - at goal, continue current meds        Antoine Poche, M.D.

## 2022-05-24 NOTE — Patient Instructions (Signed)
Medication Instructions:  Continue all current medications.   Labwork: none  Testing/Procedures: none  Follow-Up: 6 months   Any Other Special Instructions Will Be Listed Below (If Applicable).   If you need a refill on your cardiac medications before your next appointment, please call your pharmacy.  

## 2022-06-04 DIAGNOSIS — E1165 Type 2 diabetes mellitus with hyperglycemia: Secondary | ICD-10-CM | POA: Diagnosis not present

## 2022-09-22 DIAGNOSIS — R21 Rash and other nonspecific skin eruption: Secondary | ICD-10-CM | POA: Diagnosis not present

## 2022-09-22 DIAGNOSIS — Z299 Encounter for prophylactic measures, unspecified: Secondary | ICD-10-CM | POA: Diagnosis not present

## 2022-09-22 DIAGNOSIS — I509 Heart failure, unspecified: Secondary | ICD-10-CM | POA: Diagnosis not present

## 2022-09-22 DIAGNOSIS — I10 Essential (primary) hypertension: Secondary | ICD-10-CM | POA: Diagnosis not present

## 2022-09-22 DIAGNOSIS — E1165 Type 2 diabetes mellitus with hyperglycemia: Secondary | ICD-10-CM | POA: Diagnosis not present

## 2022-10-05 DIAGNOSIS — E1165 Type 2 diabetes mellitus with hyperglycemia: Secondary | ICD-10-CM | POA: Diagnosis not present

## 2022-11-05 DIAGNOSIS — E1165 Type 2 diabetes mellitus with hyperglycemia: Secondary | ICD-10-CM | POA: Diagnosis not present

## 2022-12-01 ENCOUNTER — Other Ambulatory Visit: Payer: Self-pay | Admitting: Cardiology

## 2022-12-02 ENCOUNTER — Encounter: Payer: Self-pay | Admitting: Cardiology

## 2022-12-02 ENCOUNTER — Ambulatory Visit: Payer: Medicare PPO | Attending: Cardiology | Admitting: Cardiology

## 2022-12-02 VITALS — BP 124/68 | HR 96 | Ht 63.5 in | Wt 111.0 lb

## 2022-12-02 DIAGNOSIS — E782 Mixed hyperlipidemia: Secondary | ICD-10-CM | POA: Diagnosis not present

## 2022-12-02 DIAGNOSIS — I10 Essential (primary) hypertension: Secondary | ICD-10-CM

## 2022-12-02 NOTE — Progress Notes (Signed)
Clinical Summary Desiree Baxter is a 82 y.o.femaleseen today for follow up of the following medical problem.s      1. Chest pain - history of chest pain in the past, previously worked up by Dr Diona Browner. She had an equivocal nuclear stress test followed by a cath which showed normal coronaries in 11/2010. -  GXT for SOB showed nonspecific ST/T changes, f/u lexiscan without ischemia in 2017   - denies any recent symptoms       2. Dizziness - can occur with bending over - episode yesteday, singing in choir. +dizzy, felt hot, no nausea, no palpitations.  - sat down and felt better - not much water day. Mountin dew sundrop caffeine x 2 cups.    - no recent symptoms.       3. HTN - compliant with meds - she reports she had been on lisionpril, she stopped due to her SOB.    - she is compliant with meds but has not taken yet today        4. Mitral regurgitation - mild to moderate by echo in 2012 [facility] 06/2015 with LVEF 60-65%, mild MR, mild to mod TR   -Jan 2023 echo LVEF 60-65%, grade I dd, mild MR   5. SOB - echo Jan 2017 LVEF 60-65%, grade I diastolic dysfunction, mild MR, mild to mod TR,   - GXT which did show some ST/T changes, no O2 desaturations, exaggerated heart rate response and hypertenstive response. Exercised 40 sec.   - 5/2017lexiscan negative for ischemia - notes some improvement in breathing. She stopped lisionpril on her own as trial to see if improved.     - She reports PFTs at [facility] showed some COPD. Also had some anemia, received iron treatments.    - no recent symptoms.    5. Hyperlipidemia - not sure if she is taking atorvastatin.   Jan 2023 TC 174 TG 93 HDL 67 LDL 90   SH: her husband is also patient of mine Desiree Baxter     Past Medical History:  Diagnosis Date   Diabetes mellitus    Essential hypertension, benign    Lipoma    right lower leg   Mixed hyperlipidemia    Type 2 diabetes mellitus (HCC)    Neuropathy    Type II or unspecified type diabetes mellitus with neurological manifestations, not stated as uncontrolled(250.60)      Allergies  Allergen Reactions   Elavil [Amitriptyline Hcl]     Too sleepy     Current Outpatient Medications  Medication Sig Dispense Refill   amLODipine (NORVASC) 10 MG tablet Take 1 tablet by mouth daily.     amoxicillin (AMOXIL) 500 MG capsule Take 4 capsules by mouth as directed. Prior to dental work  0   aspirin EC 81 MG tablet Take 81 mg by mouth daily.     atorvastatin (LIPITOR) 10 MG tablet TAKE 1 TABLET BY MOUTH DAILY 30 tablet 6   Cinnamon 500 MG capsule Take 1,000 mg by mouth 2 (two) times daily.     FARXIGA 10 MG TABS tablet Take 10 mg by mouth daily.     Flaxseed, Linseed, (FLAX SEED OIL) 1000 MG CAPS Take 1 capsule by mouth 2 (two) times daily.     glipiZIDE (GLUCOTROL XL) 5 MG 24 hr tablet Take 5 mg by mouth daily.     JARDIANCE 25 MG TABS tablet Take 25 mg by mouth daily.  metFORMIN (GLUCOPHAGE) 500 MG tablet Take 1,000 mg by mouth 2 (two) times daily with a meal.      metoprolol succinate (TOPROL-XL) 25 MG 24 hr tablet Take 25 mg by mouth daily.     Multiple Vitamin (MULTIVITAMIN WITH MINERALS) TABS tablet Take 1 tablet by mouth daily.     No current facility-administered medications for this visit.   Facility-Administered Medications Ordered in Other Visits  Medication Dose Route Frequency Provider Last Rate Last Admin   fentaNYL (SUBLIMAZE) injection 25-50 mcg  25-50 mcg Intravenous Q5 min PRN Laurene Footman, MD         Past Surgical History:  Procedure Laterality Date   CARDIAC CATHETERIZATION     4 months ago   CARPAL TUNNEL RELEASE     right wrist   CATARACT EXTRACTION W/PHACO  05/13/2011   Procedure: CATARACT EXTRACTION PHACO AND INTRAOCULAR LENS PLACEMENT (IOC);  Surgeon: Gemma Payor;  Location: [facility];  Service: Ophthalmology;  Laterality: Right;  CDE=17.48   CATARACT EXTRACTION W/PHACO Left 10/15/2013   Procedure: CATARACT  EXTRACTION PHACO AND INTRAOCULAR LENS PLACEMENT (IOC);  Surgeon: Gemma Payor, MD;  Location: [facility];  Service: Ophthalmology;  Laterality: Left;  CDE 10.56   Cataract surgery     TEMPOROMANDIBULAR JOINT SURGERY Bilateral 19 yrs ago   TUBAL LIGATION     mmh     Allergies  Allergen Reactions   Elavil [Amitriptyline Hcl]     Too sleepy      Family History  Problem Relation Age of Onset   Cancer Father    Dementia Mother    Breast cancer Sister    Breast cancer Maternal Grandmother    Breast cancer Sister    Anesthesia problems Neg Hx    Hypotension Neg Hx    Malignant hyperthermia Neg Hx    Pseudochol deficiency Neg Hx      Social History Desiree Baxter reports that she has never smoked. She has never used smokeless tobacco. Desiree Baxter reports no history of alcohol use.   Review of Systems CONSTITUTIONAL: No weight loss, fever, chills, weakness or fatigue.  HEENT: Eyes: No visual loss, blurred vision, double vision or yellow sclerae.No hearing loss, sneezing, congestion, runny nose or sore throat.  SKIN: No rash or itching.  CARDIOVASCULAR: per hpi RESPIRATORY: No shortness of breath, cough or sputum.  GASTROINTESTINAL: No anorexia, nausea, vomiting or diarrhea. No abdominal pain or blood.  GENITOURINARY: No burning on urination, no polyuria NEUROLOGICAL: No headache, dizziness, syncope, paralysis, ataxia, numbness or tingling in the extremities. No change in bowel or bladder control.  MUSCULOSKELETAL: No muscle, back pain, joint pain or stiffness.  LYMPHATICS: No enlarged nodes. No history of splenectomy.  PSYCHIATRIC: No history of depression or anxiety.  ENDOCRINOLOGIC: No reports of sweating, cold or heat intolerance. No polyuria or polydipsia.  Marland Kitchen   Physical Examination Vitals:   12/02/22 1111 12/02/22 1151  BP: (!) 140/60 124/68  Pulse: 96   SpO2: 96%    Filed Weights   12/02/22 1111  Weight: 111 lb (50.3 kg)    Gen: resting comfortably, no acute  distress HEENT: no scleral icterus, pupils equal round and reactive, no palptable cervical adenopathy,  CV: RRR, no m/rg no jvd Resp: Clear to auscultation bilaterally GI: abdomen is soft, non-tender, non-distended, normal bowel sounds, no hepatosplenomegaly MSK: extremities are warm, no edema.  Skin: warm, no rash Neuro:  no focal deficits Psych: appropriate affect   Diagnostic Studies  11/2010 cath HEMODYNAMIC DATA:  Aortic pressure  is 172/87 with a mean of 124 mmHg, left ventricular pressure is 168 with EDP of 21 mmHg.   ANGIOGRAPHIC DATA:  The left coronary arises and distributes normally. The left main coronary artery is relatively short and is normal. The left anterior descending artery appears normal throughout. The left circumflex coronary artery is normal.  It gives rise to 3 obtuse marginal vessels. The right coronary artery arises and distributes normally.  It is a normal vessel. Left ventricular angiography was performed in the RAO view.  This demonstrates normal left ventricular size and contractility.  Ejection fraction is estimated at 65%.  There was no mitral insufficiency or prolapse.   FINAL INTERPRETATION: 1. Normal coronary anatomy. 2. Normal left ventricular function.   08/2010 echo LVEF 60-65%, diastolic function not desribed, mild to mod MR, mild to mod TR   09/2015 GXT Equivocal GXT results. There was approximately 0.5-1 mm upslopingST segment depression noted in leads II, III, and aVF as well as V4 through V6 6. Baseline ST segment abnormalities also present. No chest pain reported. There was exaggerated increase in heart rate as well as hypertensive response. No oxygen desaturation noted with activity (96% in early recovery). Duke treadmill score technically intermediate risk due to limited exercise time and ST segment changes. Would correlate clinically. There was exaggerated increase in heart rate as well as hypertensive response. No oxygen desaturation  noted with activity (96% in early recovery). Duke treadmill score technically intermediate risk due to limited exercise time and ST segment changes. Would correlate clinically.   10/2015 Lexiscan MPI No T wave inversion was noted during stress. There was no ST segment deviation noted during stress. The study is normal. There are no defects consistent with prior infarct or current ischemia This is a low risk study. The left ventricular ejection fraction is hyperdynamic (>65%).   Jan 2017 echo LVEF 60-65%, grade I diastolic dysfunction, normal RV, mild MR, mild to mod TR,      Jan 2023 echo IMPRESSIONS     1. Left ventricular ejection fraction, by estimation, is 60 to 65%. The  left ventricle has normal function. The left ventricle has no regional  wall motion abnormalities. Left ventricular diastolic parameters are  consistent with Grade I diastolic  dysfunction (impaired relaxation).   2. Right ventricular systolic function is normal. The right ventricular  size is normal. There is normal pulmonary artery systolic pressure.   3. The mitral valve is normal in structure. Mild mitral valve  regurgitation. No evidence of mitral stenosis.   4. The aortic valve is tricuspid. There is mild calcification of the  aortic valve. There is mild thickening of the aortic valve. Aortic valve  regurgitation is not visualized. No aortic stenosis is present.   5. The inferior vena cava is normal in size with greater than 50%  respiratory variability, suggesting right atrial pressure of 3 mmHg.        Assessment and Plan  1. HTN - at goal on manual check recheck, she will continue current meds    2. Hyperlipidemia - request pcp labs, continue current meds  EKG today shows NSR   F/u 6 months      Antoine Poche, M.D.,

## 2022-12-02 NOTE — Patient Instructions (Signed)
Medication Instructions:  Continue all current medications.   Labwork: none  Testing/Procedures: none  Follow-Up: 6 months   Any Other Special Instructions Will Be Listed Below (If Applicable).   If you need a refill on your cardiac medications before your next appointment, please call your pharmacy.  

## 2022-12-05 DIAGNOSIS — E1165 Type 2 diabetes mellitus with hyperglycemia: Secondary | ICD-10-CM | POA: Diagnosis not present

## 2022-12-13 DIAGNOSIS — I509 Heart failure, unspecified: Secondary | ICD-10-CM | POA: Diagnosis not present

## 2022-12-13 DIAGNOSIS — I739 Peripheral vascular disease, unspecified: Secondary | ICD-10-CM | POA: Diagnosis not present

## 2022-12-13 DIAGNOSIS — J449 Chronic obstructive pulmonary disease, unspecified: Secondary | ICD-10-CM | POA: Diagnosis not present

## 2022-12-13 DIAGNOSIS — Z0000 Encounter for general adult medical examination without abnormal findings: Secondary | ICD-10-CM | POA: Diagnosis not present

## 2022-12-13 DIAGNOSIS — I10 Essential (primary) hypertension: Secondary | ICD-10-CM | POA: Diagnosis not present

## 2022-12-13 DIAGNOSIS — Z299 Encounter for prophylactic measures, unspecified: Secondary | ICD-10-CM | POA: Diagnosis not present

## 2022-12-14 DIAGNOSIS — R5383 Other fatigue: Secondary | ICD-10-CM | POA: Diagnosis not present

## 2022-12-14 DIAGNOSIS — E559 Vitamin D deficiency, unspecified: Secondary | ICD-10-CM | POA: Diagnosis not present

## 2022-12-14 DIAGNOSIS — Z79899 Other long term (current) drug therapy: Secondary | ICD-10-CM | POA: Diagnosis not present

## 2022-12-14 DIAGNOSIS — E7800 Pure hypercholesterolemia, unspecified: Secondary | ICD-10-CM | POA: Diagnosis not present

## 2023-01-03 DIAGNOSIS — Z299 Encounter for prophylactic measures, unspecified: Secondary | ICD-10-CM | POA: Diagnosis not present

## 2023-01-03 DIAGNOSIS — I10 Essential (primary) hypertension: Secondary | ICD-10-CM | POA: Diagnosis not present

## 2023-01-03 DIAGNOSIS — I509 Heart failure, unspecified: Secondary | ICD-10-CM | POA: Diagnosis not present

## 2023-01-03 DIAGNOSIS — E1165 Type 2 diabetes mellitus with hyperglycemia: Secondary | ICD-10-CM | POA: Diagnosis not present

## 2023-01-05 DIAGNOSIS — E1165 Type 2 diabetes mellitus with hyperglycemia: Secondary | ICD-10-CM | POA: Diagnosis not present

## 2023-01-31 DIAGNOSIS — Z299 Encounter for prophylactic measures, unspecified: Secondary | ICD-10-CM | POA: Diagnosis not present

## 2023-01-31 DIAGNOSIS — E1169 Type 2 diabetes mellitus with other specified complication: Secondary | ICD-10-CM | POA: Diagnosis not present

## 2023-01-31 DIAGNOSIS — I10 Essential (primary) hypertension: Secondary | ICD-10-CM | POA: Diagnosis not present

## 2023-02-05 DIAGNOSIS — E1165 Type 2 diabetes mellitus with hyperglycemia: Secondary | ICD-10-CM | POA: Diagnosis not present

## 2023-03-07 DIAGNOSIS — E1165 Type 2 diabetes mellitus with hyperglycemia: Secondary | ICD-10-CM | POA: Diagnosis not present

## 2023-03-15 DIAGNOSIS — Z299 Encounter for prophylactic measures, unspecified: Secondary | ICD-10-CM | POA: Diagnosis not present

## 2023-03-15 DIAGNOSIS — E1165 Type 2 diabetes mellitus with hyperglycemia: Secondary | ICD-10-CM | POA: Diagnosis not present

## 2023-03-15 DIAGNOSIS — I509 Heart failure, unspecified: Secondary | ICD-10-CM | POA: Diagnosis not present

## 2023-03-15 DIAGNOSIS — I10 Essential (primary) hypertension: Secondary | ICD-10-CM | POA: Diagnosis not present

## 2023-04-06 DIAGNOSIS — E1165 Type 2 diabetes mellitus with hyperglycemia: Secondary | ICD-10-CM | POA: Diagnosis not present

## 2023-04-13 ENCOUNTER — Other Ambulatory Visit: Payer: Self-pay | Admitting: Internal Medicine

## 2023-04-13 DIAGNOSIS — Z1231 Encounter for screening mammogram for malignant neoplasm of breast: Secondary | ICD-10-CM

## 2023-04-18 DIAGNOSIS — Z299 Encounter for prophylactic measures, unspecified: Secondary | ICD-10-CM | POA: Diagnosis not present

## 2023-04-18 DIAGNOSIS — I739 Peripheral vascular disease, unspecified: Secondary | ICD-10-CM | POA: Diagnosis not present

## 2023-04-18 DIAGNOSIS — I509 Heart failure, unspecified: Secondary | ICD-10-CM | POA: Diagnosis not present

## 2023-04-18 DIAGNOSIS — I10 Essential (primary) hypertension: Secondary | ICD-10-CM | POA: Diagnosis not present

## 2023-04-18 DIAGNOSIS — J449 Chronic obstructive pulmonary disease, unspecified: Secondary | ICD-10-CM | POA: Diagnosis not present

## 2023-04-18 DIAGNOSIS — E1169 Type 2 diabetes mellitus with other specified complication: Secondary | ICD-10-CM | POA: Diagnosis not present

## 2023-04-28 ENCOUNTER — Ambulatory Visit
Admission: RE | Admit: 2023-04-28 | Discharge: 2023-04-28 | Disposition: A | Discharge status: Home / Self Care | Payer: Medicare PPO | Source: Ambulatory Visit | Attending: Internal Medicine | Admitting: Internal Medicine

## 2023-04-28 DIAGNOSIS — Z1231 Encounter for screening mammogram for malignant neoplasm of breast: Secondary | ICD-10-CM | POA: Diagnosis not present

## 2023-05-06 DIAGNOSIS — E1165 Type 2 diabetes mellitus with hyperglycemia: Secondary | ICD-10-CM | POA: Diagnosis not present

## 2023-05-18 ENCOUNTER — Ambulatory Visit: Payer: Medicare PPO | Attending: Cardiology | Admitting: Cardiology

## 2023-05-18 ENCOUNTER — Encounter: Payer: Self-pay | Admitting: Cardiology

## 2023-05-18 VITALS — BP 118/64 | HR 100 | Ht 63.0 in | Wt 111.0 lb

## 2023-05-18 DIAGNOSIS — I10 Essential (primary) hypertension: Secondary | ICD-10-CM | POA: Diagnosis not present

## 2023-05-18 DIAGNOSIS — I340 Nonrheumatic mitral (valve) insufficiency: Secondary | ICD-10-CM | POA: Diagnosis not present

## 2023-05-18 DIAGNOSIS — E782 Mixed hyperlipidemia: Secondary | ICD-10-CM

## 2023-05-18 NOTE — Patient Instructions (Signed)

## 2023-05-18 NOTE — Progress Notes (Signed)
Clinical Summary Desiree Baxter is a 82 y.o. female seen today for follow up of the following medical problems.    1. Chest pain - history of chest pain in the past, previously worked up by Dr Diona Browner. She had an equivocal nuclear stress test followed by a cath which showed normal coronaries in 11/2010. -  GXT for SOB showed nonspecific ST/T changes, f/u lexiscan without ischemia in 2017   - denies any chest pains.        2. Dizziness - can occur with bending over - episode yesteday, singing in choir. +dizzy, felt hot, no nausea, no palpitations.  - sat down and felt better - not much water day. [place] dew sundrop caffeine x 2 cups.    -resolved, has not been a recent issue     3. HTN - she is compliant with meds     4. Mitral regurgitation - mild to moderate by echo in 2012 [facility] 06/2015 with LVEF 60-65%, mild MR, mild to mod TR   -Jan 2023 echo LVEF 60-65%, grade I dd, mild MR, mild TR   5. SOB - echo Jan 2017 LVEF 60-65%, grade I diastolic dysfunction, mild MR, mild to mod TR,   - GXT which did show some ST/T changes, no O2 desaturations, exaggerated heart rate response and hypertenstive response. Exercised 40 sec.   - 5/2017lexiscan negative for ischemia - notes some improvement in breathing. She stopped lisionpril on her own as trial to see if improved.   - She reports PFTs at [facility] showed some COPD. Also had some anemia, received iron treatments.    - resolved, has not been a recent issue   5. Hyperlipidemia - 12/2022 TC 133 TG 73 HDL 70 LD 48   SH: her husband is also patient of mine [name]      Past Medical History:  Diagnosis Date   Diabetes mellitus    Essential hypertension, benign    Lipoma    right lower leg   Mixed hyperlipidemia    Type 2 diabetes mellitus (HCC)    Neuropathy   Type II or unspecified type diabetes mellitus with neurological manifestations, not stated as uncontrolled(250.60)      Allergies   Allergen Reactions   Elavil [Amitriptyline Hcl]     Too sleepy     Current Outpatient Medications  Medication Sig Dispense Refill   amLODipine (NORVASC) 10 MG tablet Take 1 tablet by mouth daily.     amoxicillin (AMOXIL) 500 MG capsule Take 4 capsules by mouth as directed. Prior to dental work  0   aspirin EC 81 MG tablet Take 81 mg by mouth daily.     atorvastatin (LIPITOR) 10 MG tablet TAKE 1 TABLET BY MOUTH DAILY 30 tablet 6   Cinnamon 500 MG capsule Take 1,000 mg by mouth 2 (two) times daily.     FARXIGA 10 MG TABS tablet Take 10 mg by mouth daily.     Flaxseed, Linseed, (FLAX SEED OIL) 1000 MG CAPS Take 1 capsule by mouth 2 (two) times daily.     glipiZIDE (GLUCOTROL XL) 5 MG 24 hr tablet Take 5 mg by mouth daily.     JARDIANCE 25 MG TABS tablet Take 25 mg by mouth daily.     metFORMIN (GLUCOPHAGE) 500 MG tablet Take 1,000 mg by mouth 2 (two) times daily with a meal.      metoprolol succinate (TOPROL-XL) 25 MG 24 hr tablet Take 25  mg by mouth daily.     Multiple Vitamin (MULTIVITAMIN WITH MINERALS) TABS tablet Take 1 tablet by mouth daily.     No current facility-administered medications for this visit.   Facility-Administered Medications Ordered in Other Visits  Medication Dose Route Frequency Provider Last Rate Last Admin   fentaNYL (SUBLIMAZE) injection 25-50 mcg  25-50 mcg Intravenous Q5 min PRN Laurene Footman, MD         Past Surgical History:  Procedure Laterality Date   CARDIAC CATHETERIZATION     4 months ago   CARPAL TUNNEL RELEASE     right wrist   CATARACT EXTRACTION W/PHACO  05/13/2011   Procedure: CATARACT EXTRACTION PHACO AND INTRAOCULAR LENS PLACEMENT (IOC);  Surgeon: Gemma Payor;  Location: [facility];  Service: Ophthalmology;  Laterality: Right;  CDE=17.48   CATARACT EXTRACTION W/PHACO Left 10/15/2013   Procedure: CATARACT EXTRACTION PHACO AND INTRAOCULAR LENS PLACEMENT (IOC);  Surgeon: Gemma Payor, MD;  Location: [facility];  Service: Ophthalmology;  Laterality:  Left;  CDE 10.56   Cataract surgery     TEMPOROMANDIBULAR JOINT SURGERY Bilateral 19 yrs ago   TUBAL LIGATION     mmh     Allergies  Allergen Reactions   Elavil [Amitriptyline Hcl]     Too sleepy      Family History  Problem Relation Age of Onset   Cancer Father    Dementia Mother    Breast cancer Sister    Breast cancer Maternal Grandmother    Breast cancer Sister    Anesthesia problems Neg Hx    Hypotension Neg Hx    Malignant hyperthermia Neg Hx    Pseudochol deficiency Neg Hx      Social History Ms. Amparano reports that she has never smoked. She has never used smokeless tobacco. Ms. Tallo reports no history of alcohol use.   Review of Systems CONSTITUTIONAL: No weight loss, fever, chills, weakness or fatigue.  HEENT: Eyes: No visual loss, blurred vision, double vision or yellow sclerae.No hearing loss, sneezing, congestion, runny nose or sore throat.  SKIN: No rash or itching.  CARDIOVASCULAR: per hpi RESPIRATORY: No shortness of breath, cough or sputum.  GASTROINTESTINAL: No anorexia, nausea, vomiting or diarrhea. No abdominal pain or blood.  GENITOURINARY: No burning on urination, no polyuria NEUROLOGICAL: No headache, dizziness, syncope, paralysis, ataxia, numbness or tingling in the extremities. No change in bowel or bladder control.  MUSCULOSKELETAL: No muscle, back pain, joint pain or stiffness.  LYMPHATICS: No enlarged nodes. No history of splenectomy.  PSYCHIATRIC: No history of depression or anxiety.  ENDOCRINOLOGIC: No reports of sweating, cold or heat intolerance. No polyuria or polydipsia.  Marland Kitchen   Physical Examination Today's Vitals   05/18/23 1359  BP: 118/64  Pulse: 100  SpO2: 94%  Weight: 111 lb (50.3 kg)  Height: 5\' 3"  (1.6 m)   Body mass index is 19.66 kg/m.  Gen: resting comfortably, no acute distress HEENT: no scleral icterus, pupils equal round and reactive, no palptable cervical adenopathy,  CV: RRR, no mrg, no jvd Resp: Clear to  auscultation bilaterally GI: abdomen is soft, non-tender, non-distended, normal bowel sounds, no hepatosplenomegaly MSK: extremities are warm, no edema.  Skin: warm, no rash Neuro:  no focal deficits Psych: appropriate affect   Diagnostic Studies   11/2010 cath HEMODYNAMIC DATA:  Aortic pressure is 172/87 with a mean of 124 mmHg, left ventricular pressure is 168 with EDP of 21 mmHg.   ANGIOGRAPHIC DATA:  The left coronary arises and distributes normally. The left  main coronary artery is relatively short and is normal. The left anterior descending artery appears normal throughout. The left circumflex coronary artery is normal.  It gives rise to 3 obtuse marginal vessels. The right coronary artery arises and distributes normally.  It is a normal vessel. Left ventricular angiography was performed in the RAO view.  This demonstrates normal left ventricular size and contractility.  Ejection fraction is estimated at 65%.  There was no mitral insufficiency or prolapse.   FINAL INTERPRETATION: 1. Normal coronary anatomy. 2. Normal left ventricular function.   08/2010 echo LVEF 60-65%, diastolic function not desribed, mild to mod MR, mild to mod TR   09/2015 GXT Equivocal GXT results. There was approximately 0.5-1 mm upslopingST segment depression noted in leads II, III, and aVF as well as V4 through V6 6. Baseline ST segment abnormalities also present. No chest pain reported. There was exaggerated increase in heart rate as well as hypertensive response. No oxygen desaturation noted with activity (96% in early recovery). Duke treadmill score technically intermediate risk due to limited exercise time and ST segment changes. Would correlate clinically. There was exaggerated increase in heart rate as well as hypertensive response. No oxygen desaturation noted with activity (96% in early recovery). Duke treadmill score technically intermediate risk due to limited exercise time and ST segment  changes. Would correlate clinically.   10/2015 Lexiscan MPI No T wave inversion was noted during stress. There was no ST segment deviation noted during stress. The study is normal. There are no defects consistent with prior infarct or current ischemia This is a low risk study. The left ventricular ejection fraction is hyperdynamic (>65%).   Jan 2017 echo LVEF 60-65%, grade I diastolic dysfunction, normal RV, mild MR, mild to mod TR,      Jan 2023 echo IMPRESSIONS     1. Left ventricular ejection fraction, by estimation, is 60 to 65%. The  left ventricle has normal function. The left ventricle has no regional  wall motion abnormalities. Left ventricular diastolic parameters are  consistent with Grade I diastolic  dysfunction (impaired relaxation).   2. Right ventricular systolic function is normal. The right ventricular  size is normal. There is normal pulmonary artery systolic pressure.   3. The mitral valve is normal in structure. Mild mitral valve  regurgitation. No evidence of mitral stenosis.   4. The aortic valve is tricuspid. There is mild calcification of the  aortic valve. There is mild thickening of the aortic valve. Aortic valve  regurgitation is not visualized. No aortic stenosis is present.   5. The inferior vena cava is normal in size with greater than 50%  respiratory variability, suggesting right atrial pressure of 3 mmHg.       Assessment and Plan   1. HTN - at goal, continue current meds     2. Hyperlipidemia - well controlled, continue current meds  3. Valvular heart disease - mild MR and TR, repeat echo 3-5 years   F/u 1 year   Antoine Poche, M.D.

## 2023-05-19 DIAGNOSIS — Z299 Encounter for prophylactic measures, unspecified: Secondary | ICD-10-CM | POA: Diagnosis not present

## 2023-05-19 DIAGNOSIS — I10 Essential (primary) hypertension: Secondary | ICD-10-CM | POA: Diagnosis not present

## 2023-05-19 DIAGNOSIS — I509 Heart failure, unspecified: Secondary | ICD-10-CM | POA: Diagnosis not present

## 2023-05-19 DIAGNOSIS — E1169 Type 2 diabetes mellitus with other specified complication: Secondary | ICD-10-CM | POA: Diagnosis not present

## 2023-06-06 DIAGNOSIS — E1165 Type 2 diabetes mellitus with hyperglycemia: Secondary | ICD-10-CM | POA: Diagnosis not present

## 2023-06-21 DIAGNOSIS — Z299 Encounter for prophylactic measures, unspecified: Secondary | ICD-10-CM | POA: Diagnosis not present

## 2023-06-21 DIAGNOSIS — E1169 Type 2 diabetes mellitus with other specified complication: Secondary | ICD-10-CM | POA: Diagnosis not present

## 2023-06-21 DIAGNOSIS — I10 Essential (primary) hypertension: Secondary | ICD-10-CM | POA: Diagnosis not present

## 2023-06-21 DIAGNOSIS — I509 Heart failure, unspecified: Secondary | ICD-10-CM | POA: Diagnosis not present

## 2023-07-03 ENCOUNTER — Other Ambulatory Visit: Payer: Self-pay | Admitting: Cardiology

## 2023-07-06 DIAGNOSIS — E1165 Type 2 diabetes mellitus with hyperglycemia: Secondary | ICD-10-CM | POA: Diagnosis not present

## 2023-07-22 DIAGNOSIS — I10 Essential (primary) hypertension: Secondary | ICD-10-CM | POA: Diagnosis not present

## 2023-07-22 DIAGNOSIS — E1169 Type 2 diabetes mellitus with other specified complication: Secondary | ICD-10-CM | POA: Diagnosis not present

## 2023-07-22 DIAGNOSIS — Z299 Encounter for prophylactic measures, unspecified: Secondary | ICD-10-CM | POA: Diagnosis not present

## 2023-08-19 DIAGNOSIS — Z299 Encounter for prophylactic measures, unspecified: Secondary | ICD-10-CM | POA: Diagnosis not present

## 2023-08-19 DIAGNOSIS — I739 Peripheral vascular disease, unspecified: Secondary | ICD-10-CM | POA: Diagnosis not present

## 2023-08-19 DIAGNOSIS — E1169 Type 2 diabetes mellitus with other specified complication: Secondary | ICD-10-CM | POA: Diagnosis not present

## 2023-08-19 DIAGNOSIS — I10 Essential (primary) hypertension: Secondary | ICD-10-CM | POA: Diagnosis not present

## 2023-09-19 DIAGNOSIS — I509 Heart failure, unspecified: Secondary | ICD-10-CM | POA: Diagnosis not present

## 2023-09-19 DIAGNOSIS — R350 Frequency of micturition: Secondary | ICD-10-CM | POA: Diagnosis not present

## 2023-09-19 DIAGNOSIS — I10 Essential (primary) hypertension: Secondary | ICD-10-CM | POA: Diagnosis not present

## 2023-09-19 DIAGNOSIS — Z299 Encounter for prophylactic measures, unspecified: Secondary | ICD-10-CM | POA: Diagnosis not present

## 2023-09-19 DIAGNOSIS — E1169 Type 2 diabetes mellitus with other specified complication: Secondary | ICD-10-CM | POA: Diagnosis not present

## 2023-11-05 DIAGNOSIS — E1165 Type 2 diabetes mellitus with hyperglycemia: Secondary | ICD-10-CM | POA: Diagnosis not present

## 2023-12-05 DIAGNOSIS — E1165 Type 2 diabetes mellitus with hyperglycemia: Secondary | ICD-10-CM | POA: Diagnosis not present

## 2023-12-14 DIAGNOSIS — Z0000 Encounter for general adult medical examination without abnormal findings: Secondary | ICD-10-CM | POA: Diagnosis not present

## 2023-12-14 DIAGNOSIS — E1169 Type 2 diabetes mellitus with other specified complication: Secondary | ICD-10-CM | POA: Diagnosis not present

## 2023-12-14 DIAGNOSIS — I509 Heart failure, unspecified: Secondary | ICD-10-CM | POA: Diagnosis not present

## 2023-12-14 DIAGNOSIS — Z299 Encounter for prophylactic measures, unspecified: Secondary | ICD-10-CM | POA: Diagnosis not present

## 2023-12-14 DIAGNOSIS — J449 Chronic obstructive pulmonary disease, unspecified: Secondary | ICD-10-CM | POA: Diagnosis not present

## 2023-12-14 DIAGNOSIS — I10 Essential (primary) hypertension: Secondary | ICD-10-CM | POA: Diagnosis not present

## 2023-12-30 DIAGNOSIS — Z299 Encounter for prophylactic measures, unspecified: Secondary | ICD-10-CM | POA: Diagnosis not present

## 2023-12-30 DIAGNOSIS — I509 Heart failure, unspecified: Secondary | ICD-10-CM | POA: Diagnosis not present

## 2023-12-30 DIAGNOSIS — E119 Type 2 diabetes mellitus without complications: Secondary | ICD-10-CM | POA: Diagnosis not present

## 2023-12-30 DIAGNOSIS — I10 Essential (primary) hypertension: Secondary | ICD-10-CM | POA: Diagnosis not present

## 2024-01-05 DIAGNOSIS — E1165 Type 2 diabetes mellitus with hyperglycemia: Secondary | ICD-10-CM | POA: Diagnosis not present

## 2024-01-29 ENCOUNTER — Other Ambulatory Visit: Payer: Self-pay | Admitting: Cardiology

## 2024-02-04 DIAGNOSIS — E1165 Type 2 diabetes mellitus with hyperglycemia: Secondary | ICD-10-CM | POA: Diagnosis not present

## 2024-02-14 DIAGNOSIS — E1169 Type 2 diabetes mellitus with other specified complication: Secondary | ICD-10-CM | POA: Diagnosis not present

## 2024-02-14 DIAGNOSIS — I10 Essential (primary) hypertension: Secondary | ICD-10-CM | POA: Diagnosis not present

## 2024-02-14 DIAGNOSIS — I509 Heart failure, unspecified: Secondary | ICD-10-CM | POA: Diagnosis not present

## 2024-02-14 DIAGNOSIS — Z299 Encounter for prophylactic measures, unspecified: Secondary | ICD-10-CM | POA: Diagnosis not present

## 2024-03-29 ENCOUNTER — Other Ambulatory Visit: Payer: Self-pay | Admitting: Internal Medicine

## 2024-03-29 DIAGNOSIS — Z1231 Encounter for screening mammogram for malignant neoplasm of breast: Secondary | ICD-10-CM

## 2024-04-30 ENCOUNTER — Ambulatory Visit
Admission: RE | Admit: 2024-04-30 | Discharge: 2024-04-30 | Disposition: A | Source: Ambulatory Visit | Attending: Internal Medicine | Admitting: Internal Medicine

## 2024-04-30 DIAGNOSIS — Z1231 Encounter for screening mammogram for malignant neoplasm of breast: Secondary | ICD-10-CM

## 2024-05-27 ENCOUNTER — Other Ambulatory Visit: Payer: Self-pay | Admitting: Cardiology
